# Patient Record
Sex: Female | Born: 1935 | Race: White | Hispanic: No | Smoking: Never smoker
Health system: Southern US, Community
[De-identification: ages and names within clinical notes are randomized; demographics above are authoritative.]

## PROBLEM LIST (undated history)

## (undated) DIAGNOSIS — I219 Acute myocardial infarction, unspecified: Secondary | ICD-10-CM

## (undated) DIAGNOSIS — F419 Anxiety disorder, unspecified: Secondary | ICD-10-CM

## (undated) DIAGNOSIS — M1990 Unspecified osteoarthritis, unspecified site: Secondary | ICD-10-CM

## (undated) DIAGNOSIS — E079 Disorder of thyroid, unspecified: Secondary | ICD-10-CM

## (undated) DIAGNOSIS — S12100A Unspecified displaced fracture of second cervical vertebra, initial encounter for closed fracture: Secondary | ICD-10-CM

## (undated) DIAGNOSIS — I2510 Atherosclerotic heart disease of native coronary artery without angina pectoris: Secondary | ICD-10-CM

## (undated) DIAGNOSIS — I509 Heart failure, unspecified: Secondary | ICD-10-CM

## (undated) DIAGNOSIS — I422 Other hypertrophic cardiomyopathy: Secondary | ICD-10-CM

## (undated) DIAGNOSIS — R21 Rash and other nonspecific skin eruption: Secondary | ICD-10-CM

## (undated) DIAGNOSIS — R296 Repeated falls: Secondary | ICD-10-CM

## (undated) DIAGNOSIS — G479 Sleep disorder, unspecified: Secondary | ICD-10-CM

## (undated) DIAGNOSIS — M25559 Pain in unspecified hip: Secondary | ICD-10-CM

## (undated) DIAGNOSIS — I714 Abdominal aortic aneurysm, without rupture, unspecified: Secondary | ICD-10-CM

## (undated) DIAGNOSIS — K921 Melena: Secondary | ICD-10-CM

## (undated) DIAGNOSIS — F0280 Dementia in other diseases classified elsewhere without behavioral disturbance: Secondary | ICD-10-CM

## (undated) DIAGNOSIS — G309 Alzheimer's disease, unspecified: Secondary | ICD-10-CM

## (undated) DIAGNOSIS — M4800 Spinal stenosis, site unspecified: Secondary | ICD-10-CM

## (undated) DIAGNOSIS — Z9889 Other specified postprocedural states: Secondary | ICD-10-CM

## (undated) DIAGNOSIS — R7989 Other specified abnormal findings of blood chemistry: Secondary | ICD-10-CM

## (undated) DIAGNOSIS — I10 Essential (primary) hypertension: Secondary | ICD-10-CM

## (undated) HISTORY — PX: MASTOIDECTOMY: SHX711

## (undated) HISTORY — DX: Other specified abnormal findings of blood chemistry: R79.89

## (undated) HISTORY — DX: Rash and other nonspecific skin eruption: R21

## (undated) HISTORY — DX: Sleep disorder, unspecified: G47.9

## (undated) HISTORY — DX: Pain in unspecified hip: M25.559

## (undated) HISTORY — DX: Other specified postprocedural states: Z98.890

---

## 1999-08-26 DIAGNOSIS — I219 Acute myocardial infarction, unspecified: Secondary | ICD-10-CM

## 1999-08-26 HISTORY — DX: Acute myocardial infarction, unspecified: I21.9

## 2000-05-30 ENCOUNTER — Inpatient Hospital Stay (HOSPITAL_COMMUNITY): Admission: EM | Admit: 2000-05-30 | Discharge: 2000-06-02 | Payer: Self-pay | Admitting: *Deleted

## 2001-09-20 ENCOUNTER — Encounter: Payer: Self-pay | Admitting: Internal Medicine

## 2001-09-20 ENCOUNTER — Ambulatory Visit (HOSPITAL_COMMUNITY): Admission: RE | Admit: 2001-09-20 | Discharge: 2001-09-20 | Payer: Self-pay | Admitting: Internal Medicine

## 2001-10-06 ENCOUNTER — Ambulatory Visit (HOSPITAL_COMMUNITY): Admission: RE | Admit: 2001-10-06 | Discharge: 2001-10-06 | Payer: Self-pay | Admitting: Internal Medicine

## 2001-10-26 ENCOUNTER — Encounter: Payer: Self-pay | Admitting: Internal Medicine

## 2001-10-26 ENCOUNTER — Ambulatory Visit (HOSPITAL_COMMUNITY): Admission: RE | Admit: 2001-10-26 | Discharge: 2001-10-26 | Payer: Self-pay | Admitting: Internal Medicine

## 2001-12-16 ENCOUNTER — Ambulatory Visit (HOSPITAL_COMMUNITY): Admission: RE | Admit: 2001-12-16 | Discharge: 2001-12-16 | Payer: Self-pay | Admitting: Cardiology

## 2001-12-17 ENCOUNTER — Encounter: Payer: Self-pay | Admitting: Cardiology

## 2002-01-24 ENCOUNTER — Ambulatory Visit (HOSPITAL_COMMUNITY): Admission: RE | Admit: 2002-01-24 | Discharge: 2002-01-24 | Payer: Self-pay | Admitting: Cardiology

## 2002-01-26 ENCOUNTER — Ambulatory Visit (HOSPITAL_COMMUNITY): Admission: RE | Admit: 2002-01-26 | Discharge: 2002-01-26 | Payer: Self-pay | Admitting: Cardiology

## 2002-11-08 ENCOUNTER — Ambulatory Visit (HOSPITAL_COMMUNITY): Admission: RE | Admit: 2002-11-08 | Discharge: 2002-11-08 | Payer: Self-pay | Admitting: Internal Medicine

## 2002-11-08 ENCOUNTER — Encounter: Payer: Self-pay | Admitting: Internal Medicine

## 2002-11-24 DIAGNOSIS — Z9889 Other specified postprocedural states: Secondary | ICD-10-CM

## 2002-11-24 HISTORY — DX: Other specified postprocedural states: Z98.890

## 2002-12-20 ENCOUNTER — Ambulatory Visit (HOSPITAL_COMMUNITY): Admission: RE | Admit: 2002-12-20 | Discharge: 2002-12-20 | Payer: Self-pay | Admitting: Internal Medicine

## 2002-12-20 HISTORY — PX: COLONOSCOPY: SHX174

## 2003-11-02 ENCOUNTER — Ambulatory Visit (HOSPITAL_COMMUNITY): Admission: RE | Admit: 2003-11-02 | Discharge: 2003-11-02 | Payer: Self-pay | Admitting: Internal Medicine

## 2003-12-28 ENCOUNTER — Ambulatory Visit (HOSPITAL_COMMUNITY): Admission: RE | Admit: 2003-12-28 | Discharge: 2003-12-28 | Payer: Self-pay | Admitting: Cardiology

## 2004-01-08 ENCOUNTER — Ambulatory Visit (HOSPITAL_COMMUNITY): Admission: RE | Admit: 2004-01-08 | Discharge: 2004-01-08 | Payer: Self-pay | Admitting: Internal Medicine

## 2004-02-23 ENCOUNTER — Ambulatory Visit (HOSPITAL_COMMUNITY): Admission: RE | Admit: 2004-02-23 | Discharge: 2004-02-23 | Payer: Self-pay | Admitting: Cardiology

## 2004-12-04 ENCOUNTER — Ambulatory Visit (HOSPITAL_COMMUNITY): Admission: RE | Admit: 2004-12-04 | Discharge: 2004-12-04 | Payer: Self-pay | Admitting: Internal Medicine

## 2005-01-23 ENCOUNTER — Ambulatory Visit: Payer: Self-pay | Admitting: Cardiology

## 2005-05-21 ENCOUNTER — Ambulatory Visit (HOSPITAL_COMMUNITY): Admission: RE | Admit: 2005-05-21 | Discharge: 2005-05-21 | Payer: Self-pay | Admitting: Internal Medicine

## 2005-06-24 ENCOUNTER — Ambulatory Visit: Payer: Self-pay | Admitting: Cardiology

## 2005-06-25 ENCOUNTER — Ambulatory Visit: Payer: Self-pay | Admitting: *Deleted

## 2005-06-25 ENCOUNTER — Ambulatory Visit (HOSPITAL_COMMUNITY): Admission: RE | Admit: 2005-06-25 | Discharge: 2005-06-25 | Payer: Self-pay | Admitting: Cardiology

## 2005-07-07 ENCOUNTER — Ambulatory Visit: Payer: Self-pay | Admitting: Cardiology

## 2005-07-08 ENCOUNTER — Encounter (HOSPITAL_COMMUNITY): Admission: RE | Admit: 2005-07-08 | Discharge: 2005-08-07 | Payer: Self-pay | Admitting: Cardiology

## 2005-07-08 ENCOUNTER — Ambulatory Visit: Payer: Self-pay | Admitting: *Deleted

## 2005-07-22 ENCOUNTER — Ambulatory Visit: Payer: Self-pay | Admitting: Cardiology

## 2005-12-05 ENCOUNTER — Ambulatory Visit (HOSPITAL_COMMUNITY): Admission: RE | Admit: 2005-12-05 | Discharge: 2005-12-05 | Payer: Self-pay | Admitting: Internal Medicine

## 2006-06-05 ENCOUNTER — Emergency Department (HOSPITAL_COMMUNITY): Admission: EM | Admit: 2006-06-05 | Discharge: 2006-06-05 | Payer: Self-pay | Admitting: Emergency Medicine

## 2006-06-08 ENCOUNTER — Inpatient Hospital Stay (HOSPITAL_COMMUNITY): Admission: AD | Admit: 2006-06-08 | Discharge: 2006-06-11 | Payer: Self-pay | Admitting: Orthopedic Surgery

## 2006-06-13 ENCOUNTER — Inpatient Hospital Stay (HOSPITAL_COMMUNITY): Admission: EM | Admit: 2006-06-13 | Discharge: 2006-06-16 | Payer: Self-pay | Admitting: Emergency Medicine

## 2006-06-15 ENCOUNTER — Ambulatory Visit: Payer: Self-pay | Admitting: Cardiology

## 2006-06-22 ENCOUNTER — Ambulatory Visit (HOSPITAL_COMMUNITY): Admission: RE | Admit: 2006-06-22 | Discharge: 2006-06-22 | Payer: Self-pay | Admitting: Internal Medicine

## 2006-12-23 ENCOUNTER — Ambulatory Visit (HOSPITAL_COMMUNITY): Admission: RE | Admit: 2006-12-23 | Discharge: 2006-12-23 | Payer: Self-pay | Admitting: Internal Medicine

## 2008-01-05 ENCOUNTER — Ambulatory Visit (HOSPITAL_COMMUNITY): Admission: RE | Admit: 2008-01-05 | Discharge: 2008-01-05 | Payer: Self-pay | Admitting: Internal Medicine

## 2009-03-13 ENCOUNTER — Encounter: Payer: Self-pay | Admitting: Cardiology

## 2009-08-25 DIAGNOSIS — I422 Other hypertrophic cardiomyopathy: Secondary | ICD-10-CM

## 2009-08-25 HISTORY — DX: Other hypertrophic cardiomyopathy: I42.2

## 2009-11-29 ENCOUNTER — Inpatient Hospital Stay (HOSPITAL_COMMUNITY): Admission: EM | Admit: 2009-11-29 | Discharge: 2009-11-30 | Payer: Self-pay | Admitting: Emergency Medicine

## 2009-11-29 ENCOUNTER — Ambulatory Visit: Payer: Self-pay | Admitting: Cardiology

## 2009-11-30 ENCOUNTER — Encounter: Payer: Self-pay | Admitting: Cardiology

## 2009-12-03 ENCOUNTER — Ambulatory Visit (HOSPITAL_COMMUNITY): Admission: RE | Admit: 2009-12-03 | Discharge: 2009-12-03 | Payer: Self-pay | Admitting: Cardiovascular Disease

## 2009-12-03 ENCOUNTER — Ambulatory Visit: Payer: Self-pay | Admitting: Cardiology

## 2009-12-03 ENCOUNTER — Encounter: Payer: Self-pay | Admitting: Cardiovascular Disease

## 2009-12-14 ENCOUNTER — Ambulatory Visit (HOSPITAL_COMMUNITY): Admission: RE | Admit: 2009-12-14 | Discharge: 2009-12-14 | Payer: Self-pay | Admitting: Internal Medicine

## 2009-12-31 ENCOUNTER — Ambulatory Visit (HOSPITAL_COMMUNITY): Admission: RE | Admit: 2009-12-31 | Discharge: 2009-12-31 | Payer: Self-pay | Admitting: Internal Medicine

## 2010-01-03 ENCOUNTER — Encounter: Payer: Self-pay | Admitting: Orthopedic Surgery

## 2010-01-03 ENCOUNTER — Ambulatory Visit (HOSPITAL_COMMUNITY): Admission: RE | Admit: 2010-01-03 | Discharge: 2010-01-03 | Payer: Self-pay | Admitting: Internal Medicine

## 2010-01-14 ENCOUNTER — Ambulatory Visit: Payer: Self-pay | Admitting: Orthopedic Surgery

## 2010-01-14 DIAGNOSIS — M4800 Spinal stenosis, site unspecified: Secondary | ICD-10-CM

## 2010-01-14 DIAGNOSIS — M479 Spondylosis, unspecified: Secondary | ICD-10-CM | POA: Insufficient documentation

## 2010-01-14 DIAGNOSIS — M545 Low back pain, unspecified: Secondary | ICD-10-CM | POA: Insufficient documentation

## 2010-01-14 HISTORY — DX: Spinal stenosis, site unspecified: M48.00

## 2010-01-23 ENCOUNTER — Encounter: Payer: Self-pay | Admitting: Orthopedic Surgery

## 2010-06-25 ENCOUNTER — Encounter: Payer: Self-pay | Admitting: Orthopedic Surgery

## 2010-07-01 ENCOUNTER — Ambulatory Visit (HOSPITAL_COMMUNITY)
Admission: RE | Admit: 2010-07-01 | Discharge: 2010-07-01 | Payer: Self-pay | Source: Home / Self Care | Admitting: Internal Medicine

## 2010-09-24 NOTE — Letter (Signed)
Summary: [facility] medical record request  [facility] medical record request   Imported By: Jacklynn Ganong 07/25/2010 14:02:31  _____________________________________________________________________  External Attachment:    Type:   Image     Comment:   External Document

## 2010-09-24 NOTE — Assessment & Plan Note (Signed)
Summary: LEFT HIP PAIN XR/MRI AT [facility] BRINGING DISC/BLUE MEDICARE/FAGAN/BSF   Vital Signs:  Patient profile:   77 year old female Height:      60 inches Weight:      93 pounds Pulse rate:   76 / minute Resp:     16 per minute  Vitals Entered By: Fuller Canada MD (Jan 14, 2010 10:55 AM)  Visit Type:  consult Referring Provider:  self Primary Provider:  Dr. Ouida Sills  CC:  left hip pain.  History of Present Illness: I saw Barbara Snyder in the office today for an initial visit.  She is a 77 years old woman with the complaint of:  left hip pain.  77 year old female with deteriorating health and developing Alzheimer's disease presents with a history of LEFT hip pain for the last 2 months.  She describes a throbbing intermittent chronic pain which is 9/10.  She does get some relief with 2 hydrocodone up to 8 per day.  Her pain is worse with movement.  She has weakness in the LEFT lower extremity.  Her gait has deteriorated and she started to lean to the LEFT.  She's had a CT scan of her head which was negative.  Imaging studies which were done include MRI LEFT hip Dec 31, 2009 with no abnormalities.  LEFT hip x-rays no acute abnormality  She places her hand over her LEFT gluteal region which is where her pain is and it radiates somewhat towards the thigh.  She denies any numbness or tingling.  Dr. Ouida Sills has included his last office note for our review.  This note indicates that the patient has been worked up for stroke with a CT she does have some Alzheimer's disease we also note that she has come in for analysis of her walking on April 21 of this year.  Meds: Metoprolol, Lasix, Simvastatin, Ambien, Spironolactone, ASA, Tylenol, Multivitamin, Vicodin 5, 8 per day limit.  Allergies (verified): No Known Drug Allergies  Past History:  Past Medical History: heart murmur chronic hip pain skin rashes arthriis thyroid tumor, benign htn cholesterol trouble sleeping asthma  Past  Surgical History: left ear as child  Family History: FH of Cancer:  Family History Coronary Heart Disease female < 59 Family History of Arthritis  Social History: Patient is married.  retired no smoking no alcohol 1 cup of coffee per day  Review of Systems Constitutional:  Complains of weight loss and fatigue; denies weight gain, fever, and chills. Cardiovascular:  Complains of murmurs; denies chest pain, palpitations, and fainting. Respiratory:  Denies short of breath, wheezing, couch, tightness, pain on inspiration, and snoring . Gastrointestinal:  Denies heartburn, nausea, vomiting, diarrhea, constipation, and blood in your stools. Genitourinary:  Denies frequency, urgency, difficulty urinating, painful urination, flank pain, and bleeding in urine. Neurologic:  Complains of unsteady gait; denies numbness, tingling, dizziness, tremors, and seizure. Musculoskeletal:  Complains of joint pain and stiffness; denies swelling, instability, redness, heat, and muscle pain. Endocrine:  Complains of heat or cold intolerance; denies excessive thirst and exessive urination. Psychiatric:  Complains of anxiety; denies nervousness, depression, and hallucinations. Skin:  Complains of changes in the skin, poor healing, and rash; denies itching and redness. HEENT:  Denies blurred or double vision, eye pain, redness, and watering. Immunology:  Denies seasonal allergies, sinus problems, and allergic to bee stings. Hemoatologic:  Complains of brusing; denies easy bleeding.  Physical Exam  Additional Exam:  General vital signs as recorded, small frail elderly female no acute deformities but she  does ambulate with a severe kyphosis of the thoracic spine  Vascular exam is normal  Skin exam normal  Neurologic exam shows no evidence of a positive straight leg raise.  Lymph nodes negative  She does have tenderness in the lower back in the gluteal region shows a large thoracic kyphosis.  She has normal  range of motion and pain-free range of motion of both hips with normal motor strength in her lower extremities and no joint instability and the hip knee or ankle either side.     Impression & Recommendations:  Problem # 1:  LOW BACK PAIN (ICD-724.2)  Orders: Consultation Level III (26948) Lumbosacral Spine ,2/3 views (72100)  Problem # 2:  SPINAL STENOSIS (ICD-724.00)  Orders: Consultation Level III (54627) Lumbosacral Spine ,2/3 views (72100)  Problem # 3:  SPONDYLOSIS (ICD-721.90)  x-rays of the lumbar spine shows that she has a severe thoracolumbar kyphosis with an old compression fracture thought to be at the lumbar one.  She has severe osteopenia and osteoporosis most likely.  She has degenerative disc disease.  She has scoliosis.  She is not a surgical candidate.  She will need some home care as she is getting, she should continue Vicodin for pain as well as add Neurontin 100 mg t.i.d. which can be increased and titrated to get the desired effect  Orders: Consultation Level III (03500) Lumbosacral Spine ,2/3 views (72100)  Medications Added to Medication List This Visit: 1)  Neurontin 100 Mg Caps (Gabapentin) .Marland Kitchen.. 1 by mouth three times a day  Patient Instructions: 1)  continue vicodin  2)  start neurontin 100 three times a day  3)  return as needed  4)  call us if u need a refiil\par Prescriptions: NEURONTIN 100 MG CAPS (GABAPENTIN) 1 by mouth three times a day  #90 x 1   Entered and Authorized by:   Fuller Canada MD   Signed by:   Fuller Canada MD on 01/14/2010   Method used:   Print then Give to Patient   RxID:   [id number]

## 2010-09-24 NOTE — Letter (Signed)
Summary: History form  History form   Imported By: Jacklynn Ganong 01/18/2010 08:46:12  _____________________________________________________________________  External Attachment:    Type:   Image     Comment:   External Document

## 2010-09-24 NOTE — Miscellaneous (Signed)
Summary: Health care power of attorney  Health care power of attorney   Imported By: Jacklynn Ganong 01/23/2010 14:35:39  _____________________________________________________________________  External Attachment:    Type:   Image     Comment:   External Document

## 2010-09-24 NOTE — Letter (Signed)
Summary: *Orthopedic Consult Note  [facility]  [address] [place] [address]  [place], [place] [address]   Phone: [phone]  Fax: [phone]    Re:    LAPORCHE MARTELLE DOB:    June 14, 1920   Dear: Dr. Ouida Sills   Thank you for requesting that we see the above patient for consultation.  A copy of the detailed office note will be sent under separate cover, for your review.  Evaluation today is consistent with:  1)  LOW BACK PAIN (ICD-724.2) 2)  SPINAL STENOSIS (ICD-724.00) 3)  SPONDYLOSIS (ICD-721.90) 4)  FAMILY HISTORY OF ARTHRITIS (ICD-V17.7) 5)  FAMILY HISTORY CORONARY HEART DISEASE FEMALE < 55 (ICD-V17.3)   Our recommendation is for: medical management.  Continue Vicodin and add Neurontin       Thank you for this opportunity to look after your patient.  Sincerely,   Terrance Mass. MD.

## 2010-11-13 LAB — BASIC METABOLIC PANEL
BUN: 13 mg/dL (ref 6–23)
CO2: 25 mEq/L (ref 19–32)
Calcium: 8.9 mg/dL (ref 8.4–10.5)
Chloride: 94 mEq/L — ABNORMAL LOW (ref 96–112)
Creatinine, Ser: 0.96 mg/dL (ref 0.4–1.2)
GFR calc Af Amer: >60 mL/min (ref >60)
GFR calc non Af Amer: 55 mL/min — ABNORMAL LOW (ref >60)
Glucose, Bld: 133 mg/dL — ABNORMAL HIGH (ref 70–99)
Potassium: 4 mEq/L (ref 3.5–5.1)
Sodium: 126 mEq/L — ABNORMAL LOW (ref 135–145)

## 2010-11-13 LAB — CBC
HCT: 33.9 % — ABNORMAL LOW (ref 36.0–46.0)
Hemoglobin: 12 g/dL (ref 12.0–15.0)
MCHC: 35.3 g/dL (ref 30.0–36.0)
MCV: 100.1 fL — ABNORMAL HIGH (ref 78.0–100.0)
Platelets: 193 10*3/uL (ref 150–400)
RBC: 3.39 MIL/uL — ABNORMAL LOW (ref 3.87–5.11)
RDW: 13.9 % (ref 11.5–15.5)
WBC: 7 10*3/uL (ref 4.0–10.5)

## 2010-11-13 LAB — CARDIAC PANEL(CRET KIN+CKTOT+MB+TROPI)
CK, MB: 4 ng/mL (ref 0.3–4.0)
CK, MB: 4.4 ng/mL — ABNORMAL HIGH (ref 0.3–4.0)
Relative Index: 2.9 — ABNORMAL HIGH (ref 0.0–2.5)
Relative Index: 3 — ABNORMAL HIGH (ref 0.0–2.5)
Total CK: 135 U/L (ref 7–177)
Total CK: 150 U/L (ref 7–177)
Troponin I: 0.02 ng/mL (ref 0.00–0.06)
Troponin I: 0.02 ng/mL (ref 0.00–0.06)
Troponin I: 0.03 ng/mL (ref 0.00–0.06)

## 2010-11-13 LAB — DIFFERENTIAL
Basophils Absolute: 0 10*3/uL (ref 0.0–0.1)
Basophils Relative: 0 % (ref 0–1)
Eosinophils Relative: 0 % (ref 0–5)
Lymphocytes Relative: 9 % — ABNORMAL LOW (ref 12–46)

## 2010-11-13 LAB — POCT CARDIAC MARKERS
Troponin i, poc: <0.05 ng/mL (ref 0.00–0.09)
Troponin i, poc: <0.05 ng/mL (ref 0.00–0.09)

## 2011-01-10 NOTE — Procedures (Signed)
NAME:  Barbara Snyder, Barbara Snyder NO.:  [id number]   MEDICAL RECORD NO.:  [id number]          PATIENT TYPE:  OUT   LOCATION:  RAD                           FACILITY:  [facility]   PHYSICIAN:  Vida Roller, M.D.   DATE OF BIRTH:  12/24/1919   DATE OF PROCEDURE:  06/25/2005  DATE OF DISCHARGE:                                  ECHOCARDIOGRAM   PRIMARY CARE PHYSICIAN:  Kingsley Callander. Ouida Sills, MD   TAPE NUMBER:  [id number]   TAPE COUNT:  [id number]   HISTORY OF PRESENT ILLNESS:  This is an 77 year old female with increasing  dyspnea on exertion, assessment for LV systolic function.   This is a very technically difficult study.   M-mode tracings were not accurate.   Overall, the left ventricular size is normal.  The ejection fraction is  vigorous at 70-75%.  There is some left ventricular hypertrophy.  No obvious  wall motion abnormalities, but this is a difficult study in that multiple  nonstandard views were used.   There does not appear to be any significant valvular heart disease,  although, it is a tremendously inadequate study for this.   If significant concern exists for severe valvular heart disease,  consideration should be made for further evaluation including potentially  transesophageal echocardiogram.      Vida Roller, M.D.  Electronically Signed     JH/MEDQ  D:  06/25/2005  T:  06/25/2005  Job:  [id number]   cc:   Kingsley Callander. Ouida Sills, MD  Fax: [phone]

## 2011-01-10 NOTE — Procedures (Signed)
[facility]  Patient:    Barbara Snyder, RAUH Visit Number: [id number] MRN: [id number]          Service Type: OUT Location: RAD Attending Physician:  Cain Sieve Dictated by:   Lavella Hammock, P.A. Proc. Date: 12/16/01 Admit Date:  12/16/2001                                Stress Test  DATE OF BIRTH:  March 07, 1920  INDICATIONS:  Mrs. Mauro is an 77 year old female with a history of aortic insufficiency without significant aorto stenosis and an EF of 55-60% by recent echocardiogram.  Additionally, she had a catheterization in 2001 that showed multiple areas of 30-50% stenosis including the left main, LAD, circumflex, and ramus intermedius.  She has had dyspnea on exertion such that she is unable to go up a flight of steps without stopping to rest.  She is here today for an exercise Cardiolite.  The patient exercised for a total of 2 minutes and 16 seconds.  Her target heart rate was 119 and she achieved this within one minute.  Her Cardiolite was injected at a heart rate of 131 and the exercise continued for another minute.  Total exercise time was 2 minutes and 16 seconds.  Her resting EKG had flattening of the ST segments in the inferior and lateral leads.  There was minimal depression of the ST segments but there was no downsloping during the exercise portion of the test.  She had a good blood pressure response to exercise with maximum blood pressure of 160/70 just after the recovery period was started and a starting blood pressure of 160/70.  The patient had no chest pain during the procedure, although she became extremely short of breath and stated that she was "give out."  She had a few PVCs during the stress portion of the test, but otherwise no arrhythmia.  CONCLUSION:  Exercise treadmill test electrically negative for ischemia with Cardiolite images pending at the time of dictation. Dictated by:   Lavella Hammock,  P.A. Attending Physician:  Cain Sieve DD:  12/16/01 TD:  12/17/01 Job: [id number]

## 2011-01-10 NOTE — Op Note (Signed)
NAME:  Barbara Snyder, Barbara Snyder           ACCOUNT NO.:  [id number]   MEDICAL RECORD NO.:  [id number]          PATIENT TYPE:  INP   LOCATION:  6743                         FACILITY:  [facility]   PHYSICIAN:  Katy Fitch. Sypher, M.D. DATE OF BIRTH:  09-08-1919   DATE OF PROCEDURE:  06/09/2006  DATE OF DISCHARGE:                                 OPERATIVE REPORT   PREOPERATIVE DIAGNOSIS:  Status post open type 1 fracture, right ulna,  sustained on June 05 2006, and severely comminuted fracture of right  distal radius with a Galeazzi equivalent with complete dislocation of the  distal radioulnar joint and disruption of the distal radioulnar ligaments.   POSTOPERATIVE DIAGNOSES:  1. Type 1 open fracture, right distal ulna with evidence of inside-out      extrusion of ulnar head through skin and contamination of subcutaneous      space, treated by excisional debridement at this time.  2. Comminuted fractured distal radius with complete disruption of the      distal radioulnar joint, which was addressed with open reduction and      internal fixation after 24 hours of intravenous antibiotic therapy and      observation of vital signs to rule out early sepsis.   OPERATION:  1. Excisional debridement of type 1 open fracture of right ulna.  2. Irrigation debridement of distal radioulnar joint and distal ulnar      fracture site.  3. Open reduction internal fixation of right distal radial comminuted      intra-articular explosion-type fracture utilizing a 6-peg DVR plate      system and cancellous allograft.   OPERATING SURGEON:  Josephine Igo, M.D.   ASSISTANT:  No assistant.   ANESTHESIA:  General by LMA.   SUPERVISING ANESTHESIOLOGIST:  Guadalupe Maple, M.D.   INDICATIONS:  Barbara Snyder is an 77 year old right-hand-dominant  woman with background medical issues of coronary artery disease,  hyperlipidemia, hypertension and hearing impairment.   She was referred by her family physician,  Dr. Carylon Perches, at the suggestion  of Dr. Cleda Clarks, attending orthopedic surgeon in [place], [place], for evaluation and management of an extremely complex right wrist  injury sustained on the afternoon of June 05, 2006.   At that time, Barbara Snyder was walking in her yard near her driveway and  fell, sustaining a complex injury to her right dominant wrist.  She was  transported to [facility], where she was evaluated by Dr. Radford Pax,  the emergency room physician.  Her wounds were apparently inspected and a  cursory manner and a commentary was made that she had a wrist abrasion.   Inspection of her wrist after her referral on June 08, 2006 revealed that  she had an inside-out type 1 open fracture of her distal ulna.   Barbara Snyder was provided IV antibiotic therapy at the [facility] Emergency  Room consisting of 1 g of Ancef followed by a prescription for oral Keflex.   There is no record of her receiving tetanus prophylaxis.  She is uncertain  of her tetanus status.   She was placed  in a prefabricated plaster and foam splint by Dr. Hilda Snyder  with a Webril wrap on her skin.   She was discharged from the emergency room by Dr. Hilda Snyder, though his notes  suggested he had advised a Hand Surgery consult.   She return to see Dr. Hilda Snyder on the morning of June 08, 2006 at his  office and at that time, an urgent referral to orthopedic and hand  specialist was arranged in [place].   The referral was arranged through Dr. Ouida Sills rather than directly through a  Dr. Hilda Snyder.   On examination in the office, Barbara Snyder was noted have a 4+ swollen,  ecchymotic right hand with diminished sensibility in the median  distribution.   Her x-rays in the emergency room and her post-splinting films documented  profound displacement of her fracture fragments with disruption of the  distal radioulnar joint.   Removal of her dressing in our office revealed that she  had a tight wrap of  unyielding Webril around her wrist.  This was causing a tourniquet effect,  causing venous distension and significant swelling of her hand.  Upon  careful removal of her dressing, she had an obvious wound that was bleeding  and draining marrow fat.  I surmised in the office that she had either type  I or type II open fracture.   We advised immediate admission to [facility] in an effort to maximize the  ability to prevent sepsis of her fracture site at the ulna.   She had eaten, therefore immediate incision and drainage were not possible.  We advised her to be admitted and given 24 hours of IV Ancef with careful  observation of her arm and vital signs to be certain she was not developing  an early wound infection.   After 24 hours of observation, she remained afebrile and tolerated the Ancef  well.   She was placed at an n.p.o. status at midnight on June 08, 2006 for  surgery as an add-on on June 09, 2006.   After informed consent with Barbara Snyder, her son and her sister, she is  brought to the operating room at this time.   PROCEDURE:  Barbara Snyder was brought to the operating room and placed  in supine position upon the operating table.   Following an anesthesia consult by Dr. Ivin Booty and Dr. Noreene Larsson, an axillary  block was deferred due to her age and frail status.   General anesthesia by LMA technique was induced under Dr. Morley Kos direct  supervision, followed by Betadine scrub and paint of the right upper  extremity.  A sterile stockinette and arthroscopy drapes were applied,  followed by exsanguination of the right arm with an Esmarch bandage and  inflation of the arterial tourniquet on the proximal brachium to 230 mmHg.  The procedure commenced with an orderly excisional debridement of her open  fracture wound.  The skin margins were debrided to clean non-erythematous margins.  Subcutaneous tissues were sharply debrided with a scalpel and   tenotomy scissors.  Environmental debris including some grit was removed  from the subcutaneous space.  It was apparent that the ulnar head and neck  had extruded through the skin at some point.  Debris was found to the region  of the extensor retinaculum.   After thorough irrigation of this fracture site, a rubber band drain was  placed through-and-through from palmar to dorsal along the dorsal aspect of  the distal ulna.   The wound was then dressed  open.   Attention was then directed to the distal radial fracture.  With closed  reduction, it was apparent that Barbara Snyder had had a prior radial  fracture or growth arrest of the radius.  With reduction of the fracture,  her ulna was still 1 cm plus.  We used the C-arm fluoroscope to examine her  elbow and indeed there was no sign of a proximal ulnar or radial fracture to  account for the length disparity.   After closed reduction, we proceeded with standard approach to the volar  radius with a DVR-type incision paralleling the flexor carpi radialis.   A curvilinear incision was fashioned, exposing the flexor carpi radialis.  The radial artery was identified and gently retracted.  The fascia of the  floor of the flexor carpi radialis was incised longitudinally followed by  retraction of the flexor pollicis longus and pronator quadratus in an ulnar  direction.   The fracture site was opened with a clamp on the proximal radius by 90  degrees' rotation in an ulnar direction, followed by use of a Freer to  disimpact the fracture fragments.  Cancellous allograft was packed into the  radial styloid, reducing the articular fragment anatomically, followed by  reduction of fracture with anatomic interdigitation of the volar cortex.   A 6-peg DVR plate system was applied with standard technique, utilizing both  threaded screws in the radial styloid and lunate facet region and smooth  pins deep to the floor the 4th dorsal compartment.    Great care was taken to measure the length of screws to prevent piercing the  dorsal cortex in the 4th dorsal compartment.   The fracture site was then copiously irrigated with sterile saline and the  cortical screws placed in the radial shaft.  A virtually anatomic reduction  of the articular surface was achieved.  Barbara Snyder was noted be at least  8 mm ulnar plus and had gross instability of the distal radioulnar joint.   It is apparent that she has ruptured the dorsal and volar radioulnar  ligaments.  It is not clear whether this is acute or chronic.  She has  obviously had of prior growth arrest of the radius.   The ulnar wound was dressed open.  The wound exposing the radius was closed  by repair the pronator quadratus over the plate with a running suture of 2-0  Vicryl followed by repair of the skin with a subdermal suture of 4-0 Vicryl and intradermal 3-0 Prolene.  Adaptic was applied to the wound followed by a  voluminous sterile gauze dressing with 4 x 4's, Kerlix, sterile Webril and a  sugar-tong splint, maintaining the forearm in a neutral position and the  hand in a safe position.   There were no apparent complications.   We will address the distal radioulnar joint with closed treatment at this  time.  Given Barbara Snyder's questionable wound sepsis and frail status,  hardware was not placed across this radioulnar joint at this time.   She was wakened from general anesthesia and transferred to the recovery room  with stable vital signs.   She will be admitted to 6700 for continuation of her IV Ancef, observation  of her vital signs and will have a Foley catheter placed for 24 hours.      Katy Fitch Sypher, M.D.  Electronically Signed     RVS/MEDQ  D:  06/09/2006  T:  06/11/2006  Job:  [id number]

## 2011-01-10 NOTE — Procedures (Signed)
NAME:  Barbara Snyder, CLAYCOMB NO.:  [id number]   MEDICAL RECORD NO.:  [id number]          PATIENT TYPE:  rec   LOCATION:  RAD                           FACILITY:  [facility]   PHYSICIAN:  Edward L. Juanetta Gosling, M.D.DATE OF BIRTH:  [birth year]   DATE OF PROCEDURE:  07/29/2005  DATE OF DISCHARGE:                              PULMONARY FUNCTION TEST   FINDINGS:  1.  Spirometry is normal.  2.  Lung volumes show no evidence of restrictive change.  3.  Diffusing capacity is minimally reduced.  4.  Arterial blood gases are normal.      Edward L. Juanetta Gosling, M.D.  Electronically Signed     ELH/MEDQ  D:  07/29/2005  T:  07/30/2005  Job:  [id number]

## 2011-01-10 NOTE — H&P (Signed)
NAME:  Barbara Snyder, Barbara Snyder NO.:  [id number]   MEDICAL RECORD NO.:  [id number]          PATIENT TYPE:  INP   LOCATION:  A201                          FACILITY:  [facility]   PHYSICIAN:  Catalina Pizza, M.D.        DATE OF BIRTH:  1920-06-08   DATE OF ADMISSION:  06/13/2006  DATE OF DISCHARGE:  LH                                HISTORY & PHYSICAL   PHYSICIANS:  1. Primary M.D. is Dr. Ouida Sills.  2. Orthopedic doctor is Dr. Hilda Lias and recently Dr. Teressa Senter.   HISTORY OF PRESENT ILLNESS:  Barbara Snyder is an 77 year old white female  who recently had a fall on June 05, 2006, with fracture of her right  wrist.  Was seen and evaluated in the emergency department and seen by Dr.  Hilda Lias.  Apparently was set up for further evaluation by Dr. Teressa Senter, and  apparently had a significant open fracture requiring extensive surgical care  for this.  She was discharged on June 11, 2006 back to home.  Her son was  staying with her and apparently she did fine yesterday, but this morning at  approximately 7:00 a.m. her son got up and found her lying in the floor, as  if she was eating breakfast and had passed out, falling to the left, hitting  her head on a table beside the breakfast table resulting in a laceration and  significant bleeding to her head.  She was unresponsive initially when her  son got to her but responded shortly after he started talking with her, as  if she was waking up.  Per her son, she was mildly confused, which is  unclear exactly what her baseline is.  She does have some short-term memory  problems, per his report.  She was brought in to the emergency department  for further evaluation of this syncopal-type episode.   PAST MEDICAL HISTORY:  Unclear of her full history, but she does have a  history of coronary artery disease before, a systolic murmur mentioned  previously suggestive of mitral regurgitation, a history of hypertension,  history of hyperlipidemia, history  of gastroesophageal reflux disease, and a  question of some mild dementia and possibly some anxiety and depression.   MEDICATIONS:  At home are aspirin 81 mg p.o. daily, Lipitor 40 mg p.o.  daily, Aldactone 50 mg p.o. b.i.d., Lexapro 10 mg p.o. daily, calcium with  vitamin D p.o. daily unknown specific dose, multivitamin p.o. daily, Ambien  12.5 mg q.h.s., Keflex 500 mg p.o. q.i.d., lorazepam 0.5 mg p.o. daily,  furosemide 40 mg p.o. daily, Toprol XL 25 mg p.o., and Darvocet N 100 1  tablet q. 6h p.r.n. for pain.   ALLERGIES:  NO KNOWN DRUG ALLERGIES.   REVIEW OF SYSTEMS:  The patient denies any fever or chills.  No problems  with appetite.  Denies any recent chest pain.  No shortness of breath.  No  abdominal pains.  No constipation or diarrhea.  No significant lower  extremity swelling.  Denies any change in her vision or blurry vision.  No  dizziness or weakness.   PHYSICAL EXAMINATION:  VITAL SIGNS:  Temperature is 96.3, heart rate is 82,  blood pressure is 114/62, respiratory rate is 18, sating at 94-98% on room  air.  GENERAL:  This is an elderly white female lying in bed in no acute distress,  son is at bedside.  HEENT:  Pupils equal, round and reactive to light and accommodation.  Oropharynx is clear.  Mucous membranes are moist.  Does have approximately a  2 cm laceration to her left temporal area repaired with staples; no signs of  bleeding at this time.  NECK:  No JVD.  No thyromegaly.  Neck is supple.  LUNGS:  Good air movement throughout.  No rhonchi.  No crackles.  HEART:  Regular rate and rhythm with approximately a 2/6 systolic murmur.  ABDOMEN:  Soft, nontender and nondistended.  Positive bowel sounds.  EXTREMITIES:  No lower extremity edema.  Right arm is extensively wrapped.  She does have some bruising on her third and fourth fingers but does have  sensation to palpation on these, and does appear to have cap refill,  although all of the fingers on that hand are  cool to touch.  NEUROLOGIC:  The patient knows that she is at the hospital, unable to decide  whether at [facility] or [facility].  She is aware of a general date but  does have some signs of some underlying confusion about her medicines and,  specifically, about what occurred this morning.  She states the next thing  she remembers from this morning is coming in to the hospital, not  specifically her son waking her up.   LABORATORY DATA:  Lab work obtained.  Reveals a CBC which shows a white  count of 6 and hemoglobin of 10.7.  CMET shows a sodium of 130, potassium of  4.2, chloride of 93, CO2 of 32, glucose of 89, BUN of 17, and creatinine of  1.1.  Total bili is 0.8, alk phos is 46, SGOT is 38, SGPT is 13, total  protein is 6.2, albumin is 2.9, and calcium is 8.8.  PT is 13.7 and INR is  1.  UA was negative with specific gravity of 1.01.  Cardiac markers showed a  myoglobin of 202, troponin I of less than 0.05, and CK-MB of 5.2.   RADIOLOGIC DATA:  Images obtained did have a chest x-ray which revealed  suboptimal inspiration, no acute cardiopulmonary disease noted.   CT of the head revealed no acute intracranial abnormality, moderate  generalized atrophy, including cerebellar atrophy.   IMPRESSION:  This is an 77 year old who underwent surgery for right  extensive fracture to her forearm, who was discharged on June 11, 2006  and apparently had a syncopal episode at home this morning of unknown  etiology.   ASSESSMENT AND PLAN:  1. Syncope, unclear cause at this time.  Has had cardiology workup before.      Question whether related to cardiac arrhythmia, given its acuity, but      given all of her other issues, question whether related to medications      as far as pain medicines are concerned.  She does not appear to be      dehydrated, although orthostasis could be a possibility.  Without     direct observation of this event, it is difficult to say.  Will admit      to  telemetry and monitor for any signs of arrhythmia.  Will also get      carotid Dopplers and likely  a 2-D echo, since history was there before      of some question of mitral regurgitation.  The patient is unclear about      the medicines that she took, although she states she took all blood      pressure medicine; did not take any pain medicine.  She is also on      Ambien at night and question whether this resulted in some difficulty,      as well.  2. Right forearm fracture.  She is on pain medicine for this and she does      have a cool fingers to touch but does have some cap refill.  Need to      continue to monitor.  Apparently she did have some significant      occlusion when was seen and assessed by the orthopedic surgeon before,      with a significant amount of swelling; unclear whether need to get Dr.      Hilda Lias to assess from the ortho standpoint.  She is continued on      Keflex, since it was an open fracture and unclear exactly the length of      time she is supposed to remain on this.  3. Mild dementia.  It is unclear of her baseline, but she is able to do      most of her activities of daily living, but likely will definitely need      more of a 24-hour care.  Unclear exactly how she was taking her      medicines and the patient is unclear about this, as well.  4. History of coronary artery disease.  She has had a workup before with a      stress test which did not reveal significant problems back in December      of 2006.  She is on medical management including Lipitor and Toprol.      Unclear if she has a history of congestive heart failure, given the      fact she is on Lasix and Aldactone.   DISPOSITION:  The patient will be admitted to telemetry and will follow up  with Dr. Ouida Sills for this.  The patient is very anxious about coming in to the  hospital; will defer any medical changes to Dr. Ouida Sills.      Catalina Pizza, M.D.  Electronically Signed     ZH/MEDQ  D:   06/13/2006  T:  06/13/2006  Job:  [id number]

## 2011-01-10 NOTE — Procedures (Signed)
NAME:  Barbara Snyder, Barbara Snyder NO.:  [id number]   MEDICAL RECORD NO.:  [id number]          PATIENT TYPE:  INP   LOCATION:  A201                          FACILITY:  [facility]   PHYSICIAN:  Gerrit Friends. Dietrich Pates, MD, FACCDATE OF BIRTH:  Jan 21, 1920   DATE OF PROCEDURE:  06/15/2006  DATE OF DISCHARGE:                                  ECHOCARDIOGRAM   CLINICAL DATA:  An 77 year old woman with syncope.  Aorta 2.8, left atrium  2.3, septum 1.6, posterior wall 1.2, LV diastole 2.6, LV systole 2.0.  1. Technically suboptimal but adequate echocardiographic study.  2. Normal left atrium, right atrium and right ventricle.  3. Mild mitral valve calcification and mild annular calcification.  4. Moderate sclerosis of the aortic valve leaflets with mild to moderate      impairment in leaflet separation; very mild stenosis and insufficiency      by Doppler.  5. Moderate calcification of the aortic annulus and the wall of the      proximal ascending aorta; mild dilatation.  6. Grossly normal pulmonic valve and proximal pulmonary artery.  7. Normal tricuspid valve.  8. Normal left ventricular size; mild hypertrophy, particularly involving      the proximal septum.  Normal regional and global LV systolic function.  9. Normal IVC.  10.Comparison to prior study of June 25, 2005:  Improved technical      quality of the current study; aortic valve disease not appreciated on      the previous examination.      Gerrit Friends. Dietrich Pates, MD, [facility]  Electronically Signed     RMR/MEDQ  D:  06/15/2006  T:  06/16/2006  Job:  [id number]

## 2011-01-10 NOTE — Consult Note (Signed)
NAME:  Barbara Snyder, MILBRATH NO.:  [id number]   MEDICAL RECORD NO.:  [id number]          PATIENT TYPE:  EMS   LOCATION:  ED                            FACILITY:  [facility]   PHYSICIAN:  J. Darreld Mclean, M.D. DATE OF BIRTH:  23-May-1920   DATE OF CONSULTATION:  06/05/2006  DATE OF DISCHARGE:                                   CONSULTATION   HISTORY:  I was asked to see her by the ER physician.  The patient is a 77-  year-old female who fell injuring her right wrist.  X-ray revealed a  comminuted bone fracture of the right distal radius.  She has an abrasion on  her right distal arm and __________ .  The patient __________ .   One percent plain Xylocaine block was given and attempted closed reduction  was carried out and a sugar tong split was applied and local dressing was  applied. __________ 100 of Keflex.  He is off until Monday, __________ being  seen by the hand surgeon for open treatment and internal reduction of this  fracture.  __________ she should come back to the emergency room __________  .  Again, if she has any trouble she should come back to the emergency room.           ______________________________  Shela Commons. Darreld Mclean, M.D.     JWK/MEDQ  D:  06/05/2006  T:  06/06/2006  Job:  [id number]

## 2011-01-10 NOTE — Discharge Summary (Signed)
NAME:  Barbara Snyder, ACERO           ACCOUNT NO.:  [id number]   MEDICAL RECORD NO.:  [id number]          PATIENT TYPE:  INP   LOCATION:  6743                         FACILITY:  [facility]   PHYSICIAN:  Katy Fitch. Sypher, M.D. DATE OF BIRTH:  [birth year]   DATE OF ADMISSION:  06/08/2006  DATE OF DISCHARGE:  06/11/2006                               DISCHARGE SUMMARY   ADMISSION DIAGNOSES:  1. Right wrist Galeazzi equivalent distal radius/ulna fracture.  2. 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  3. Open ulna fracture.  4. Hypertension.  5. Coronary artery disease.  6. HOH.   DISCHARGE DIAGNOSES:  1. Right wrist Galeazzi equivalent distal radius/ulna fracture.  2. 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  3. Open ulna fracture.  4. Hypertension.  5. Coronary artery disease.  6. HOH.   OPERATION PERFORMED:  1. Irrigation and debridement of open fracture distal ulna with      excisional debridement.  2. ORIF of comminuted distal radius fracture with 6-peg DVR plate      fixation by Dr. Katy Fitch. Sypher on June 09, 2006.   CONSULTATIONS:  None.   HISTORY:  The patient is an 77 year old right hand dominant female who  is status post fall on June 05, 2006.  She sustained an open fracture  of her right distal radius.  She was seen and evaluated at [facility]  ER.  She was evaluated by Dr. Radford Pax for inspection of her wrist after  referral on June 08, 2006, revealed that she had an inside-out type I  open fracture of her distal ulna.  She was provided IV antibiotic  therapy at [facility] ER, consisting of 1 g of Ancef followed by a  prescription of oral Keflex.  She was placed in a plaster foam splint by  Dr. Hilda Lias, the orthopedic attending, with Webril wrap.  She was  discharged from the emergency room by Dr. Hilda Lias.  She returned to see  Dr. Hilda Lias on the morning of June 08, 2006, at his office.  At that  time, an urgent referral to orthopedic hand specialist was arranged  for  [facility].  She was seen in our office and she was noted to have 4+  swelling with ecchymotic right hand and diminished sensibility  throughout the median distribution.  It was decided that she would need  to be brought to the operating room to undergo ORIF of her comminuted  distal radius fracture with irrigation and debridement of her distal  ulnar open type fracture.  She had eaten in the immediate hours prior to  presentation to our office.  She was therefore admitted to the hospital  for 24 hours of IV Ancef with observation of her arm and vital signs  with anticipated surgery on June 09, 2006.  Preop labs revealed a  hemoglobin of 11.2, hematocrit of 32.8, white blood cell count of 6.3,  and 187,000 platelets.  Chemistry profile revealed a low sodium at 126,  chloride low at 92, albumin low at 3.2.  The remainder of the chemistry  profile within normal limits.  Her urinalysis essentially normal, except  for  a mild amount of urobilinogen.  Preop EKG revealed normal sinus  rhythm and preop chest x-ray revealed hyperinflation without acute or  superimposed abnormality.   HOSPITAL COURSE:  The patient was admitted as stated above.  On June 08, 2006, she was given Ancef 1 g IV q.8 hours for approximately 24  hours, maintained on her usual preoperative medications, and she  received a combination of Dilaudid and Percocet for pain control.  She  was then made n.p.o. after midnight on June 08, 2006, and brought to  the operating room on June 09, 2006.  She underwent ORIF of her right  distal radius fracture and irrigation and debridement of her open  fracture of the distal ulna with excisional debridement.  Postoperatively she was placed back on her usual preop medications,  given additional Ancef in the form of 1 g IV q.8 hours for 3 more doses.  The first day postop, on June 10, 2006, she was afebrile with stable  vital signs.  O2 saturation was 97%.  She was  sitting up in bedside  chair and tolerating this well.  She had a good appetite.  No nausea or  vomiting.  Her Foley had been removed.  She had not voided at the time  of rounds.  On exam her dressing was dry and intact.  Neurovascularly  she was intact and she had good range of motion of her digits.  The  following day, on June 11, 2006, her second day postop, she remained  afebrile with stable vital signs.  She was voiding well.  No bowel  movement at that time.  Neurovascularly she was intact.  She had full  range of motion of her digits.  At this time it was felt that the  patient was stable and ready for discharge with her family.  Her sister  was very attentive to her needs and her son was retired from work and  was available for assistance on an as-needed basis.   At this time, the patient was given prescriptions for Percocet 5 mg,  #30, one or two p.o. q.4-6 hours p.r.n. pain and she was asked to  continue her other medications as usual in the form of:  1. Metoprolol 25 mg one p.o. q. day.  2. Aldactone 50 mg 1 p.o. q. day.  3. Lipitor 40 mg 1 p.o. q. day.  4. Lorazepam 0.5 mg 1 p.o. b.i.d. p.r.n.  5. Lexapro 10 mg 1 p.o. q. day.  6. Lasix 40 mg 1 p.o. q. day.   ACTIVITY:  The patient will continue to elevate her arm, work on finger  range of motion, in addition to her shoulder.   WOUND CARE:  She will keep her dressing dry and intact.   RETURN APPOINTMENT:  She will return to our office in approximately one  week, or sooner should she have any problems.   CONDITION ON DISCHARGE:  Stable and improving.   FINAL DISCHARGE DIAGNOSIS:  Galeazzi equivalent right distal radius  fracture with open ulna fracture requiring open reduction internal  fixation and debridement.      Jonni Sanger, P.A.      Katy Fitch Sypher, M.D.  Electronically Signed  RJD/MEDQ  D:  09/15/2006  T:  09/15/2006  Job:  [id number]

## 2011-01-10 NOTE — Procedures (Signed)
NAME:  Barbara Snyder, Barbara Snyder NO.:  [id number]   MEDICAL RECORD NO.:  [id number]          PATIENT TYPE:  REC   LOCATION:                                FACILITY:  [facility]   PHYSICIAN:  Vida Roller, M.D.   DATE OF BIRTH:  February 03, 1920   DATE OF PROCEDURE:  DATE OF DISCHARGE:                                    STRESS TEST   INDICATIONS:  [name] is an 77 year old female with known coronary  disease, status post non-Q wave MI in October 2001.  She had a 40% left main  and a 50% circumflex with an EF of 50% at that time.  She also has  hypertension and hyperlipidemia.  She complains of exertional dyspnea and  occasional chest discomfort.   BASELINE DATA:  Electrocardiogram reveals a sinus rhythm at 78 beats per  minute with nonspecific ST abnormalities.  Blood pressure is 128/68.   The patient exercised for a total of 3 minutes and 15 seconds in Bruce  Protocol stage I.  She reached 4.6 METS.  Maximum heart rate was 144 beats  per minute which is 105% of predicted maximum.  Maximum blood pressure is  170/62 and resolved down to 132/68 in recovery.  The patient reported  dyspnea with exercise that resolved in recovery.  No chest pain.  EKG  revealed no ischemic changes.  A few PVCs were noted.  Exercise was stopped  secondary to fatigue.   Final images and results are pending M.D. review.      Jae Dire, P.A. [facility]      Vida Roller, M.D.  Electronically Signed    AB/MEDQ  D:  07/08/2005  T:  07/08/2005  Job:  [id number]

## 2011-01-10 NOTE — Cardiovascular Report (Signed)
[place]  Patient:    Barbara Snyder, Barbara Snyder                  MRN: [id number] Proc. Date: 06/01/00 Adm. Date:  [id number] Attending:  Veneda Melter CC:         Enid Cutter, M.D.  Veneda Melter, M.D. [facility]   Cardiac Catheterization  PROCEDURES PERFORMED:  Left and right heart catheterization/coronary arteriography.  INDICATIONS:  Evaluate patient with congestive heart failure and a non-Q wave myocardial infarction.  PROCEDURAL NOTE:  Left and right heart catheterizations were performed via the right femoral artery and vein respectively.  Both vessels were cannulated using an anterior wall puncture.  A 6-French arterial sheath was inserted via the modified Seldinger technique.  Preformed Judkins and a pigtail catheter were utilized.  The patient tolerated the procedure well and left the laboratory in stable condition.  RESULTS:  HEMODYNAMICS: LV:  105/23. AO:  101/57. RA:  Mean 8. RV:  37/11. PA:  29/18, mean of 23. The pulmonary capillary wedge pressure mean was 16 (without a significant V-wave).  Cardiac output/cardiac index (thermodilution):  2.9/1.83. Cardiac output/cardiac index (Fick):  3.6/2.3.  CORONARY ARTERIES: 1.  The left main had an ostial 30% to 40% stenosis.  2.  The left anterior descending artery had a proximal long 25% stenosis.     There was distal 30% stenosis.  The first diagonal had ostial 25%     stenosis.  3.  The circumflex had ostial 50% stenosis and mid 25% stenosis.  4.  The ramus intermedius had ostial 30% stenosis.  5.  The right coronary artery was a large dominant vessel.  It had diffuse     luminal irregularities.  The vessels were calcified.  LEFT VENTRICULOGRAM:  The left ventriculogram was obtained in the RAO and LAO projections.  The ejection fraction was approximately 50% with 2+ to 3+ mitral regurgitation.  There was moderate mid anterior wall hypokinesis and mild posteroinferior  akinesis.  AORTOGRAM/DISTAL AORTOGRAM:  Aortogram and distal aortogram were obtained secondary to some difficulty advancing the wire and a tortuous course of the wire.  She did have a mildly dilated ascending aorta, however, the distal aorta was free of significant disease.  It was a tortuous vessel.  CONCLUSIONS: 1.  Nonobstructive coronary artery disease. 2.  Mildly reduced left ventricular function, with regional wall motion     abnormalities. 3.  Moderate versus moderately severe mitral regurgitation with a decreased     cardiac output.  PLAN:  The etiology of the regional wall motion in the MI is in question.  It could have been plaque rupture versus vasospasm.  Her CHF symptoms are out of proportion to the degree of left ventricular dysfunction.  This might be explained by the mitral regurgitation.  The next step will be transthoracic echocardiogram with a transesophageal echocardiogram if needed to further evaluate the degree of mitral regurgitation. DD:  06/01/00 TD:  06/01/00 Job: [id number]

## 2011-01-10 NOTE — Op Note (Signed)
NAME:  Barbara Snyder, Barbara Snyder                     ACCOUNT NO.:  [id number]   MEDICAL RECORD NO.:  [id number]                   PATIENT TYPE:  AMB   LOCATION:  DAY                                  FACILITY:  [facility]   PHYSICIAN:  R. Roetta Sessions, M.D.              DATE OF BIRTH:  11/25/1919   DATE OF PROCEDURE:  12/20/2002  DATE OF DISCHARGE:                                 OPERATIVE REPORT   PROCEDURE:  Screening colonoscopy.   ENDOSCOPIST:  Gerrit Friends. Rourk, M.D.   INDICATIONS FOR PROCEDURE:  The patient is an 77 year old lady seen at the  courtesy of Dr. Ouida Sills for colorectal cancer screening.  Family history  pertinent of her sister, who is 5 years older, for a history of colorectal  cancer.  Barbara Snyder has undergone a sigmoidoscopy previously by Dr. Ouida Sills  and no significant findings have been reported.  She has never had her  entire lower GI tract evaluated. She is devoid of any lower GI tract  symptoms.  Colonoscopy is now being done as a high risk screening maneuver.  This approach has been discussed with the patient at length.  The potential  risks, benefits, and alternatives have been reviewed, questions answered.  She is agreeable.  Please see my handwritten H&P for more information.   MONITORING:  O2 saturation, blood pressure, pulse and respirations were  monitored throughout the entire procedure.  Conscious sedation: Versed 2 mg IV, Demerol 25 mg IV.   INSTRUMENT:  Olympus video chip pediatric colonoscope.   FINDINGS:  Digital rectal exam revealed no abnormalities.   ENDOSCOPIC FINDINGS:  The prep was good.   RECTUM:  Examination of the rectal mucosa including the retroflex view of  the anal verge revealed only internal hemorrhoids.   COLON:  The colonic mucosa was surveyed from the rectosigmoid junction  through the left transverse and right colon to the area of the appendiceal  orifice, ileocecal valve, and cecum.  These structures were well seen and  photographed for the record.  The patient had a rather densely populated  left-sided diverticula.  The remainder of the colonic mucosa to the cecum  appeared normal.   From the level of the cecum and ileocecal valve the scope was slowly  withdrawn.  All previously mentioned mucosal surfaces were again seen.  No  other abnormalities were observed.  The patient tolerated the procedure well  and was reacted in endoscopy.   IMPRESSION:  1. Internal hemorrhoids otherwise normal rectum.  2. Left-sided diverticula.  3. The remainder of the colonic mucosa appeared normal.   RECOMMENDATIONS:  1. Would consider 1 more colonoscopy in 5 years if Barbara Snyder remains in     good health.  2. She is to follow up with Dr. Ouida Sills.  3. Diverticulosis literature provided to Barbara Snyder today.  Jonathon Bellows, M.D.    RMR/MEDQ  D:  12/20/2002  T:  12/20/2002  Job:  [id number]   cc:   Kingsley Callander. Ouida Sills, M.D.  [address]  [place]  [place] [address]  Fax: [phone]

## 2011-01-10 NOTE — Discharge Summary (Signed)
NAME:  Barbara Snyder, Barbara Snyder NO.:  [id number]   MEDICAL RECORD NO.:  [id number]          PATIENT TYPE:  INP   LOCATION:  A201                          FACILITY:  [facility]   PHYSICIAN:  Kingsley Callander. Ouida Sills, MD       DATE OF BIRTH:  [birth year]   DATE OF ADMISSION:  06/13/2006  DATE OF DISCHARGE:  10/23/2007LH                                 DISCHARGE SUMMARY   DISCHARGE DIAGNOSES:  1. Syncope  2. Scalp laceration  3. Right forearm fracture.  4. Coronary artery disease.  5. Primary hyperaldosteronism.  6. Nausea.  7. Normocytic anemia.  8. Hyponatremia.  9. Hypoalbuminemia.  10.Elevated amylase 199.   HOSPITAL COURSE:  This patient is an 77 year old female who presented to the  emergency room after a syncopal episode resulting in a blow to the left side  of her head.  She underwent a CT scan of the brain which revealed no sign of  subdural hematoma.  There was moderate generalized atrophy.  She was  hospitalized in a monitored setting.  Cardiac enzymes were negative.  She  underwent a carotid ultrasound, which revealed no significant stenosis.  She  complained of nausea.  She had mildly elevated amylase of 199.  Her AST was  initially elevated at 38 but a repeat was 29.  Her ALT and alkaline  phosphatase and bilirubin were normal.  She had a low albumin of 2.9.  She  was euthyroid with a TSH of 3.  She had a hemoglobin of 10.7 with an MCV of  98 with a repeat showing a hemoglobin of 11.6 with an MCV of 98.  Hyponatremic initially at 130, recheck was 132.  She has primary  hyperaldosteronism but had normal potassium at 4.2 and 4.8  There were no  cardiac arrhythmias on telemetry.  She underwent an ultrasound of her  abdomen for evaluation of her nausea and an elevated amylase and AST.  Cholelithiasis was present but there was no evidence of cholecystitis.  There was distal abdominal aortic ectasia measured at 2.4 cm.  Her chest x-  ray revealed no acute infiltrate.  She also  underwent an echocardiogram  which revealed normal regional and global LV systolic function.  There was  moderate calcification of the aortic annulus and the wall of the proximal  ascending aorta with mild dilatation.  There is moderate sclerosis of the  aortic valve leaflets with mild to moderate impairment of leaflet  separation.  Mild stenosis and insufficiency by Doppler.  There was mitral  valve calcification, which was mild and mild mitral annular calcification.  The atrial sizes were normal.  Her EKG revealed sinus rhythm with first-  degree AV block.  She had previously fallen, suffered a right forearm  fracture requiring surgery by Dr. Teressa Senter.  Her splint was left in place.  She will return to see Dr. Teressa Senter as scheduled.  There had been some  question as to her possibly taking additional doses of Darvocet and/or  Ambien prior to her syncopal episode.  She was encouraged to discontinue  each of these if possible.  This was discussed  with her son, Molly Maduro.   DISCHARGE MEDICATIONS:  1. Aspirin 325 mg daily.  2. Spironolactone 50 mg daily.  3. Lasix 40 mg daily.  4. Lipitor 40 mg daily  5. Lexapro 10 mg daily.  6. Keflex 500 mg q.i.d.  7. Tylenol Extra Strength two q.i.d. p.r.n.  8. Calcium with vitamin D b.i.d.  9. Multivitamin daily.  10.Toprol XL 25 mg daily.  11.Xanax 0.5 1/2-1 daily if needed.   As stated she will be seen by Dr. Teressa Senter in follow-up and have follow-up  with me in 2 weeks.      Kingsley Callander. Ouida Sills, MD  Electronically Signed     ROF/MEDQ  D:  07/05/2006  T:  07/05/2006  Job:  [id number]

## 2011-01-10 NOTE — Procedures (Signed)
[facility]  Patient:    Barbara Snyder, Barbara Snyder Visit Number: [id number] MRN: [id number]          Service Type: OUT Location: RAD Attending Physician:  Carylon Perches Dictated by:   Kari Baars, M.D. Proc. Date: 10/07/01 Admit Date:  09/20/2001 Discharge Date: 09/20/2001                      Pulmonary Function Test Inter.  RESULTS: 1. Spirometry is normal. 2. Lung volumes are borderline reduced. 3. DLCO moderately reduced. 4. Arterial blood gases show normal oxygenation.  The pCO2 is 44 which is    normal but toward the high side of normal. Dictated by:   Kari Baars, M.D. Attending Physician:  Carylon Perches DD:  10/07/01 TD:  10/08/01 Job: [id number]

## 2011-01-10 NOTE — H&P (Signed)
[place]  Patient:    Barbara Snyder, Barbara Snyder                  MRN: [id number] Adm. Date:  [id number] Disc. Date: [id number] Attending:  Veneda Melter Dictator:   Abelino Derrick, P.A.C. [facility] CC:         Veneda Melter, M.D. [facility]   History and Physical  HISTORY OF PRESENT ILLNESS:  Ms. Gilvin is a 77 year old female with no prior history of coronary disease, who was admitted to [facility] this morning with a two day history of chest pain and dyspnea on exertion. She states that two days ago while washing windows she became hot, broke out in a sweat and had substernal chest pain.  Since then the chest pain has been fairly constant and she has had increasing dyspnea on exertion.  She has had no change or improvement in her symptoms with aspirin.  On admission to [facility] Emergency Room, her EKG has mild nonspecific changes.  Her troponin was 18 with a CK of 255 and 17 MB.  It is likely she has had an out of hospital MI.   She is transferred now for further evaluation.  PAST MEDICAL HISTORY:  Remarkable for a long history of a heart murmur, she takes SBE prophylaxis for this and believes she might have had an echocardiogram in the past but it has been sometime.  She has gastroesophageal reflux disease symptoms.  She has had treated hypertension and previous hyperlipidemia.  She had a remote tonsillectomy and remote anal fissure repair.  She has had cataract implants.  HOME MEDICATIONS:  Include Aldactone, Lescol, aspirin and Protonix.  ALLERGIES:  No known drug allergies.  SOCIAL HISTORY:  She is married lives with her husband.  She worked at [employer] for eleven years; although, she says she was never a smoker.  FAMILY HISTORY:  Mother died at 84 of a MI.  Father died of prostate cancer in his 13s.  One sister has coronary disease and two brothers have had coronary disease, one of which has had bypass.  REVIEW OF SYSTEMS:   Essentially unremarkable except as noted above.  There is no history of peptic ulcer disease or GI bleeding.  She was treated remotely for thyroid but this has been stopped many years ago.  PHYSICAL EXAMINATION:  VITAL SIGNS:  Blood pressure 128/72, pulse 82, temperature 100.  GENERAL:  She is a well-developed well-nourished female in no acute distress.  HEENT:  Normocephalic.  Extraocular movements intact.  Sclerae nonicteric. Lids and conjunctivae are within normal limits.  NECK:  Without bruit, without JVD.  CHEST:  Essentially clear there are a few crackles in the left base.  CARDIAC:  Regular rate and rhythm with a 2/6 systolic murmur heard loudest at the left sternal border, it does radiate to the apex.  ABDOMEN:  Non-tender no hepatosplenomegaly.  EXTREMITIES:  Without edema.  Distal pulses are intact.  There are no femoral artery bruits noted.  NEUROLOGIC:  Grossly intact.  She is awake, alert, oriented and cooperative. Moves all extremities without obvious deficit.  SKIN: Warm and dry.  LABORATORY DATA:  EKG reveals normal sinus rhythm with suggestion of slight ST elevation at V2.  White count 8.8, hemoglobin 12.6, hematocrit 36,  platelet count 235,000, BUN 17, creatinine 1.4, sodium 127, potassium 4.6, troponin as noted is 18, INR 0.98.  Chest x-ray was sent with the patient, the results are pending.  IMPRESSION: 1.  Probable out of hospital myocardial infarction 47 hours old. 2. Systolic murmur, chronic, this sounds like MR. 3. Treated hypertension. 4. Treated hyperlipidemia. 5. Family history of coronary disease. 6. History of gastroesophageal reflux symptoms.  PLAN:  Will continue Heparin, aspirin.  Add beta blocker and nitrates.  Check a 2D echo Monday and consider for cardiac catheterization Monday. DD:  05/30/00 TD:  05/31/00 Job: [id number]

## 2011-04-04 ENCOUNTER — Encounter: Payer: Self-pay | Admitting: *Deleted

## 2011-04-04 ENCOUNTER — Emergency Department (HOSPITAL_COMMUNITY)
Admission: EM | Admit: 2011-04-04 | Discharge: 2011-04-04 | Disposition: A | Discharge status: Home / Self Care | Payer: Medicare Other | Attending: Emergency Medicine | Admitting: Emergency Medicine

## 2011-04-04 ENCOUNTER — Emergency Department (HOSPITAL_COMMUNITY): Payer: Medicare Other

## 2011-04-04 DIAGNOSIS — Z7982 Long term (current) use of aspirin: Secondary | ICD-10-CM | POA: Insufficient documentation

## 2011-04-04 DIAGNOSIS — G309 Alzheimer's disease, unspecified: Secondary | ICD-10-CM | POA: Insufficient documentation

## 2011-04-04 DIAGNOSIS — I509 Heart failure, unspecified: Secondary | ICD-10-CM | POA: Insufficient documentation

## 2011-04-04 DIAGNOSIS — W010XXA Fall on same level from slipping, tripping and stumbling without subsequent striking against object, initial encounter: Secondary | ICD-10-CM | POA: Insufficient documentation

## 2011-04-04 DIAGNOSIS — S0990XA Unspecified injury of head, initial encounter: Secondary | ICD-10-CM

## 2011-04-04 DIAGNOSIS — F0280 Dementia in other diseases classified elsewhere without behavioral disturbance: Secondary | ICD-10-CM | POA: Insufficient documentation

## 2011-04-04 DIAGNOSIS — S0003XA Contusion of scalp, initial encounter: Secondary | ICD-10-CM | POA: Insufficient documentation

## 2011-04-04 DIAGNOSIS — IMO0002 Reserved for concepts with insufficient information to code with codable children: Secondary | ICD-10-CM | POA: Insufficient documentation

## 2011-04-04 DIAGNOSIS — Y92009 Unspecified place in unspecified non-institutional (private) residence as the place of occurrence of the external cause: Secondary | ICD-10-CM | POA: Insufficient documentation

## 2011-04-04 DIAGNOSIS — I252 Old myocardial infarction: Secondary | ICD-10-CM | POA: Insufficient documentation

## 2011-04-04 DIAGNOSIS — I10 Essential (primary) hypertension: Secondary | ICD-10-CM | POA: Insufficient documentation

## 2011-04-04 HISTORY — DX: Dementia in other diseases classified elsewhere, unspecified severity, without behavioral disturbance, psychotic disturbance, mood disturbance, and anxiety: F02.80

## 2011-04-04 HISTORY — DX: Acute myocardial infarction, unspecified: I21.9

## 2011-04-04 HISTORY — DX: Disorder of thyroid, unspecified: E07.9

## 2011-04-04 HISTORY — DX: Heart failure, unspecified: I50.9

## 2011-04-04 HISTORY — DX: Essential (primary) hypertension: I10

## 2011-04-04 HISTORY — DX: Unspecified osteoarthritis, unspecified site: M19.90

## 2011-04-04 HISTORY — DX: Alzheimer's disease, unspecified: G30.9

## 2011-04-04 MED ORDER — BACITRACIN ZINC 500 UNIT/GM EX OINT
TOPICAL_OINTMENT | CUTANEOUS | Status: AC
  Administered 2011-04-04: 1 via TOPICAL
  Filled 2011-04-04: qty 0.9

## 2011-04-04 NOTE — ED Provider Notes (Signed)
History   Scribed for Barbara Bonier, MD, the patient was seen in room APA06/APA06 . This chart was scribed by Desma Paganini. This patient's care was started at 10:56 AM .   CSN: [id number] Arrival date & time: 04/04/2011 10:02 AM  Chief Complaint  Patient presents with  . Fall   The history is provided by the patient.   Barbara Snyder is a 77 y.o. female who presents to the Emergency Department complaining of fall against the wall while bathing herself at home just prior to arrival. Normally she has some help bathing at home but she was attempting to do it herself today. The physical challenge for her was getting out of the tub and that's when she had the mechanical slip and fall onto the right back of her head. She complains of some bleeding with hematoma over that area. She denies LOC, current HA, nausea, vomiting.   PAST MEDICAL HISTORY:  Past Medical History  Diagnosis Date  . Alzheimer disease   . Hypertension   . CHF (congestive heart failure)   . Arthritis   . Thyroid disease   . Myocardial infarct      PAST SURGICAL HISTORY:  Past Surgical History  Procedure Date  . Mastoidectomy      MEDICATIONS:   Previous Medications   ASPIRIN 81 MG EC TABLET    Take 81 mg by mouth daily.     FUROSEMIDE (LASIX) 40 MG TABLET    Take 40 mg by mouth daily.     METOPROLOL TARTRATE (LOPRESSOR) 25 MG TABLET    Take 12.5 mg by mouth 2 (two) times daily.     MIRTAZAPINE (REMERON) 15 MG TABLET    Take 7.5 mg by mouth at bedtime.     MULTIPLE VITAMINS-MINERALS (MULTIVITAMIN WITH MINERALS) TABLET    Take 1 tablet by mouth daily.     SPIRONOLACTONE (ALDACTONE) 25 MG TABLET    Take 25 mg by mouth daily.       ALLERGIES:   Allergies as of 04/04/2011  . (No Known Allergies)     FAMILY HISTORY:   No family history on file.   SOCIAL HISTORY:  History   Social History  . Marital Status: Widowed    Spouse Name: N/A    Number of Children: N/A  . Years of Education: N/A    Social History Main Topics  . Smoking status: Never Smoker   . Smokeless tobacco: None  . Alcohol Use: No  . Drug Use: No  . Sexually Active:    Other Topics Concern  . None   Social History Narrative  . None     Review of Systems  HENT: Negative for neck pain.   Musculoskeletal: Negative for back pain.   10 Systems reviewed and are negative for acute change except as noted in the HPI.  Physical Exam  BP 92/55  Pulse 74  Temp(Src) 98.2 F (36.8 C) (Oral)  Resp 16  Ht 4\' 10"  (1.473 m)  Wt 100 lb (45.36 kg)  BMI 20.90 kg/m2  SpO2 97%  Physical Exam  Constitutional: She is oriented to person, place, and time. No distress.       Elderly and frail appearing  HENT:       Superficial cut overlying hematoma right occiput  Cardiovascular: Normal rate, regular rhythm and normal heart sounds.   Pulmonary/Chest: Effort normal and breath sounds normal. No respiratory distress.  Abdominal: Soft. Bowel sounds are normal. She exhibits no distension. There  is no tenderness.  Musculoskeletal:       No tenderness of the process of the lumbar, thoracic or cervical spine. Small skin tear abrasion over extensor surface of right elbow, full range of motion, no deformity to upper extremities otherwise. Pelvis stable and non tender to palpation and passive ROM. No deformity or tenderness to lower extremities.  Neurological: She is alert and oriented to person, place, and time.  Skin: Skin is warm and dry.  Psychiatric: She has a normal mood and affect. Her behavior is normal.    ED Course  Procedures  OTHER DATA REVIEWED: Nursing notes, vital signs, and past medical records reviewed.    DIAGNOSTIC STUDIES: Oxygen Saturation is 97% on room air, normal by my interpretation.     LABS / RADIOLOGY:   XR CT scan of the patient's head shows scalp hematoma without calvarial fracture, in no acute intracranial injury or bleeding.    ED COURSE / COORDINATION OF CARE: 10:47. Initial  orders placed prior to my assessment include CT head   MDM: Differential Diagnosis: closed head injury versus minor head injury with hematoma to the scalp but I don't suspect injury to the neck back or extremities based on my exam   IMPRESSION: Minor head injury with scalp hematoma, and no intracranial injury, with mild small skin tear to right elbow, but no apparent injuries to the neck, back, or extremities.    PLAN:  Home The patient is to return the emergency department if there is any worsening of symptoms. I have reviewed the discharge instructions with the patient   CONDITION ON DISCHARGE: Good   MEDICATIONS GIVEN IN THE E.D.  Medications  aspirin 81 MG EC tablet (not administered)  Multiple Vitamins-Minerals (MULTIVITAMIN WITH MINERALS) tablet (not administered)  metoprolol tartrate (LOPRESSOR) 25 MG tablet (not administered)  furosemide (LASIX) 40 MG tablet (not administered)  spironolactone (ALDACTONE) 25 MG tablet (not administered)  mirtazapine (REMERON) 15 MG tablet (not administered)     DISCHARGE MEDICATIONS: New Prescriptions   No medications on file     Scribe Attestation I personally performed the services described in this documentation, which was scribed in my presence. The recorded information has been reviewed and considered.       Barbara Bonier, MD 04/04/11 [phone]

## 2011-04-04 NOTE — ED Notes (Signed)
Was taking a bath and slipped off of step hitting back of head on edge of bathtub.  Pt alert and oriented, denies dizziness/lightheadedness/visual disturbances.

## 2011-04-04 NOTE — ED Notes (Signed)
Assisted patient to walk to restroom.

## 2011-04-04 NOTE — ED Notes (Signed)
Pt was getting out of bath tub this morning and fell hitting head on back of tub. Area cleaned. Large size knot noted to back of head with superficial abrasion. Bleeding controlled. Pt a/o x3. C/o slight headache. Denies any dizziness. Neuro negative.

## 2011-06-12 ENCOUNTER — Encounter: Payer: Self-pay | Admitting: Gastroenterology

## 2011-06-12 ENCOUNTER — Ambulatory Visit: Payer: Medicare Other | Admitting: Urgent Care

## 2011-06-12 ENCOUNTER — Ambulatory Visit (INDEPENDENT_AMBULATORY_CARE_PROVIDER_SITE_OTHER): Payer: Medicare Other | Admitting: Gastroenterology

## 2011-06-12 VITALS — BP 110/80 | HR 64 | Temp 97.4°F | Ht 60.0 in | Wt 93.6 lb

## 2011-06-12 DIAGNOSIS — K921 Melena: Secondary | ICD-10-CM

## 2011-06-12 MED ORDER — HYDROCORTISONE ACETATE 25 MG RE SUPP: 25.0000 mg | RECTAL | Status: DC

## 2011-06-12 NOTE — Progress Notes (Signed)
Referring Provider: Carylon Perches, MD Primary Care Physician:  Carylon Perches, MD Primary Gastroenterologist:  Dr. Jena Gauss   Chief Complaint  Patient presents with  . blood on underwear x 1    HPI:   Barbara Snyder is a pleasant 77 year old petite lady who presents today with her son at the request of Dr. Ouida Sills due to an incidence of hematochezia. She last had a colonoscopy in April 2004 with left-sided diverticula and internal hemorrhoids. At that time, it was considered feasible to repeat in 5 years if she were still in good health. She presents today denying abdominal pain, nausea or vomiting. She has had no change in bowel habits. Reports a BM every day. Son says she "eats like a bird". Wt ranges between 90-100 lbs. Son states caretaker saw a wine-colored smear in her underwear about a week ago. Dr. Alonza Smoker note reports heme + stool.   CBC on 10/15: no anemia, Hgb 13.  Sister and nephew with history of colon cancer.   Past Medical History  Diagnosis Date  . Alzheimer disease   . Hypertension   . CHF (congestive heart failure)   . Arthritis   . Thyroid disease   . Myocardial infarct 2001  . S/P colonoscopy Apr 2004    left-sided diverticula, internal hemorrhoids (RMR)    Past Surgical History  Procedure Date  . Mastoidectomy     Current Outpatient Prescriptions  Medication Sig Dispense Refill  . furosemide (LASIX) 40 MG tablet Take 40 mg by mouth daily.        . metoprolol tartrate (LOPRESSOR) 25 MG tablet Take 12.5 mg by mouth 2 (two) times daily.        . mirtazapine (REMERON) 15 MG tablet Take 7.5 mg by mouth at bedtime.        . Multiple Vitamins-Minerals (MULTIVITAMIN WITH MINERALS) tablet Take 1 tablet by mouth daily.        Marland Kitchen spironolactone (ALDACTONE) 25 MG tablet Take 25 mg by mouth daily.        Marland Kitchen aspirin 81 MG EC tablet Take 81 mg by mouth daily.        . hydrocortisone (ANUSOL-HC) 25 MG suppository Place 1 suppository (25 mg total) rectally 1 day or 1 dose. Place 1  suppository in rectum each night for 7 days.  7 suppository  0    Allergies as of 06/12/2011  . (No Known Allergies)    Family History  Problem Relation Age of Onset  . Colon cancer Sister     Stage II, in remission  . Colon cancer      nephew; Stage IV    History   Social History  . Marital Status: Widowed    Spouse Name: N/A    Number of Children: N/A  . Years of Education: N/A   Occupational History  . Not on file.   Social History Main Topics  . Smoking status: Never Smoker   . Smokeless tobacco: Not on file  . Alcohol Use: No  . Drug Use: No  . Sexually Active:    Other Topics Concern  . Not on file   Social History Narrative  . No narrative on file    Review of Systems: Gen: Denies any fever, chills, loss of appetite, fatigue, weight loss. CV: Denies chest pain, heart palpitations, syncope, peripheral edema. Resp: Denies shortness of breath with rest, cough, wheezing GI: Denies dysphagia or odynophagia. Denies hematemesis, fecal incontinence, or jaundice.  GU : Denies urinary burning, urinary frequency, urinary  incontinence.  MS: Denies joint pain, muscle weakness, cramps, limited movement Derm: Denies rash, itching, dry skin Psych: Denies depression, anxiety, confusion or memory loss  Heme: Denies bruising, bleeding, and enlarged lymph nodes.  Physical Exam: BP 110/80  Pulse 64  Temp(Src) 97.4 F (36.3 C) (Tympanic)  Ht 5' (1.524 m)  Wt 93 lb 9.6 oz (42.457 kg)  BMI 18.28 kg/m2 General:   Alert and oriented. Petite, ambulates with assistance.  Head:  Normocephalic and atraumatic. Eyes:  Conjunctiva pink, sclera clear, no icterus.   Conjunctiva pink. Ears:  HOH Nose:  No deformity, discharge,  or lesions. Mouth:  No deformity or lesions, mucosa pink and moist.  Neck:  Supple, without mass or thyromegaly. Lungs:  Clear to auscultation bilaterally, without wheezing, rales, or rhonchi.  Heart:  S1, S2 present without murmurs noted.  Abdomen:   +BS, soft, non-tender and non-distended. Without mass or HSM. No rebound or guarding. No hernias noted. Rectal:  No external hemorrhoids noted, rectal exam with decent sphincter tone, no stool found on glove, no pain or discomfort Msk:  Symmetrical without gross deformities. Kyphosis Extremities:  Without clubbing or edema. Neurologic:  Alert and  oriented to person, tends to repeat the same thing at times Skin:  Intact, warm and dry without significant lesions or rashes Cervical Nodes:  No significant cervical adenopathy. Psych:  Alert and cooperative. Normal mood and affect.

## 2011-06-12 NOTE — Patient Instructions (Signed)
Use the suppositories prescribed, 1 each evening for 1 week.  We will see you back in 4 weeks to decide if we need to proceed with a colonoscopy.

## 2011-06-16 DIAGNOSIS — K921 Melena: Secondary | ICD-10-CM

## 2011-06-16 HISTORY — DX: Melena: K92.1

## 2011-06-16 NOTE — Assessment & Plan Note (Signed)
77 year old female with once incidence of wine-colored smear in her underwear, found by caretaker. No further evidence of bleeding; pt is asymptomatic with no anemia on CBC. She denies abdominal pain, change in bowel habits, nausea or vomiting. She does have a family history of colon cancer in a first-degree relative, her sister. Her last colonoscopy was in 2004, with plans to repeat in 2009 if pt were still in good health. She does have a hx of internal hemorrhoids. At this time, her son was present with her in the room. I discussed at length the risks and benefits of proceeding with such a procedure at this time, given her current state of health. She does appear somewhat frail at time of visit, but she ambulates well with assistance and seems to be overall doing fairly well for her age and comorbidities. Her son seemed to want to hold off on an invasive procedure at the moment, and I agree this may be the best option for her at this time. As she is not continuing to have evidence of hematochezia or other physical signs, I'd like to give her Anusol suppositories for 1 week and have her return in 4 weeks for reevaluation. I did inform the son that if anything at all changed with her status to contact our office. We will contemplate a possible colonoscopy at the next visit. As she does have a FH of colon cancer, I'd like to keep a close eye on her.   Anusol suppositories F/U in 4 weeks

## 2011-06-17 NOTE — Progress Notes (Signed)
Cc to PCP 

## 2011-07-10 ENCOUNTER — Encounter: Payer: Self-pay | Admitting: Gastroenterology

## 2011-07-10 ENCOUNTER — Ambulatory Visit (INDEPENDENT_AMBULATORY_CARE_PROVIDER_SITE_OTHER): Payer: Medicare Other | Admitting: Gastroenterology

## 2011-07-10 VITALS — BP 115/74 | HR 77 | Temp 97.6°F | Ht 60.0 in | Wt 95.0 lb

## 2011-07-10 DIAGNOSIS — K921 Melena: Secondary | ICD-10-CM

## 2011-07-10 NOTE — Progress Notes (Signed)
Referring Provider: Carylon Perches, MD Primary Care Physician:  Carylon Perches, MD Primary Gastroenterologist: Dr. Jena Gauss   Chief Complaint  Patient presents with  . Follow-up  . Rectal Bleeding    HPI:   Barbara Snyder is a pleasant 77 year old female who presents today for a 4 week follow-up. She was originally seen due to low-volume hematochezia a few weeks ago, and she was given anusol suppositories for 1 week. Her last colonoscopy was in 2004; she has a sister who was diagnosed with colon cancer in the past as well as a nephew. She was due for an updated colonoscopy in 2009 if health permitted.  She is here today with her son, who lives with her. He reports continued intermittent hematochezia, "smears" in underwear. However, son states it seems to be getting better. She denies any abdominal pain, change in bowel habits. Son reports a good appetite. She is up 2 lbs from last visit. She is in fairly good health, able to ambulate with mild assistance. The son seems to want to hold off on a colonoscopy at this point.  Past Medical History  Diagnosis Date  . Alzheimer disease   . Hypertension   . CHF (congestive heart failure)   . Arthritis   . Thyroid disease   . Myocardial infarct 2001  . S/P colonoscopy Apr 2004    left-sided diverticula, internal hemorrhoids (RMR)  . Hip pain   . Skin rash   . Cholesterol blood decreased   . Trouble in sleeping   . Asthma     Past Surgical History  Procedure Date  . Mastoidectomy   . Colonoscopy 12/20/02    internal hemorrhoids otherwise normal/left-side diverticula    Current Outpatient Prescriptions  Medication Sig Dispense Refill  . aspirin 81 MG EC tablet Take 81 mg by mouth daily.        . furosemide (LASIX) 40 MG tablet Take 40 mg by mouth daily.        . hydrocortisone (ANUSOL-HC) 25 MG suppository Place 1 suppository (25 mg total) rectally 1 day or 1 dose. Place 1 suppository in rectum each night for 7 days.  7 suppository  0  .  metoprolol tartrate (LOPRESSOR) 25 MG tablet Take 12.5 mg by mouth 2 (two) times daily.        . mirtazapine (REMERON) 15 MG tablet Take 7.5 mg by mouth at bedtime.        . Multiple Vitamins-Minerals (MULTIVITAMIN WITH MINERALS) tablet Take 1 tablet by mouth daily.        Marland Kitchen spironolactone (ALDACTONE) 25 MG tablet Take 25 mg by mouth daily.          Allergies as of 07/10/2011  . (No Known Allergies)    Family History  Problem Relation Age of Onset  . Colon cancer Sister     Stage II, in remission  . Colon cancer      nephew; Stage IV    History   Social History  . Marital Status: Widowed    Spouse Name: N/A    Number of Children: N/A  . Years of Education: N/A   Social History Main Topics  . Smoking status: Never Smoker   . Smokeless tobacco: None  . Alcohol Use: No  . Drug Use: No  . Sexually Active:    Other Topics Concern  . None   Social History Narrative  . None    Review of Systems: Gen: Denies fever, chills, anorexia. Denies fatigue, weakness, weight loss.  CV:  Denies chest pain, palpitations, syncope, peripheral edema, and claudication. Resp: Denies dyspnea at rest, cough, wheezing, coughing up blood, and pleurisy. GI: Denies vomiting blood, jaundice, and fecal incontinence.   Denies dysphagia or odynophagia. Derm: Denies rash, itching, dry skin Psych: Denies depression, anxiety, memory loss, confusion. No homicidal or suicidal ideation.  Heme: Denies bruising, bleeding, and enlarged lymph nodes.  Physical Exam: BP 115/74  Pulse 77  Temp(Src) 97.6 F (36.4 C) (Temporal)  Ht 5' (1.524 m)  Wt 95 lb (43.092 kg)  BMI 18.55 kg/m2 General:   Alert and oriented to person, place, situation. No distress noted. Pleasant and cooperative.  Head:  Normocephalic and atraumatic. Eyes:  Conjuctiva clear without scleral icterus. Mouth:  Oral mucosa pink and moist. Good dentition. No lesions. Heart:  S1, S2 present without murmurs, rubs, or gallops. Regular rate and  rhythm. Abdomen:  +BS, soft, non-tender and non-distended. No rebound or guarding. No HSM or masses noted. Msk:  Symmetrical without gross deformities. Stooped posture, kyphosis.  Extremities:  Without edema. Skin:  Intact without significant lesions or rashes. Psych:  Alert and cooperative. Normal mood and affect.

## 2011-07-10 NOTE — Patient Instructions (Signed)
I will be talking to Dr. Jena Gauss regarding possibly pursuing a colonoscopy.  We will be in touch shortly!!  Watch for any abdominal pain, change in bowel habits, or increased blood.

## 2011-07-13 NOTE — Assessment & Plan Note (Addendum)
77 year old female with low-volume hematochezia intermittently, no anemia on prior CBC. No other signs such as abdominal pain, change in bowel habits. +FH of colon cancer in first-degree relative, her sister. Last colonoscopy in 2004 with plans to repeat in 2009 if still in good hx. She was given a course of Anusol suppositories at the last visit; son states she continues to have intermittent smears in underwear. Her son seems hesitant to pursue any invasive procedure at this time. She is quite petite, but she seems to be in overall good health for her age and comorbidities. Due to her family history, I will discuss with Dr. Jena Gauss proceeding with colonoscopy versus carefully observing for any other changes. The son was agreeable to this as well. Further recommendations to follow in near future.   Addendum 11/26: Discussed with Dr. Jena Gauss. Recommend colonoscopy. Will inform pt and son.

## 2011-07-14 NOTE — Progress Notes (Signed)
Cc to PCP 

## 2011-07-23 NOTE — Progress Notes (Signed)
Tried to call pts son- Molly Maduro about tcs, LM for return call.

## 2011-07-28 ENCOUNTER — Other Ambulatory Visit: Payer: Self-pay | Admitting: Gastroenterology

## 2011-07-28 DIAGNOSIS — K921 Melena: Secondary | ICD-10-CM

## 2011-07-28 NOTE — Progress Notes (Signed)
Barbara Snyder tried to call pt's son today and didn't get an answer.

## 2011-07-28 NOTE — Progress Notes (Unsigned)
Spoke w/ pts son- pt is tentatively scheduled for 09/03/11- he wants to make sure this colonoscopy is completely necessary due to her age- he also said that she had not had any more episodes of bleeding  ----- Message ----- From: Evalee Mutton, LPN Sent: 78/09/9560 11:45 AM To: Hollice Espy, I have tried to contact this pts son to inform him but have been unable to reach them. There is an addendum to her note but i cant send it to anyone, i can only document on it. Will you try to call them as well. Thanks ----- Message ----- From: Gerrit Halls, NP Sent: 07/21/2011 12:32 PM To: Evalee Mutton, LPN  Hi! Talked with Dr. Jena Gauss. He recommended a colnooscopy. I cc'ed my addendum to you. Thanks!

## 2011-07-28 NOTE — Progress Notes (Unsigned)
What does the patient want to do? This is ultimately her decision. She is above average risk secondary to a first degree relative actually diagnosed with colon cancer. She has had symptoms of rectal bleeding, and she is in decent health to undergo a colonoscopy. I have discussed this with Dr. Jena Gauss, and we would be remiss to not recommend a colonoscopy. At the visit, I spent a very long time talking with the son about this procedure. He stated he wanted to hear what Dr. Jena Gauss had to say before scheduling anything. So, he now knows what our practice is recommending. I am unsure why he is hesitant about this. Can we find this out?

## 2011-07-28 NOTE — Progress Notes (Unsigned)
pts son doesn't want her to have this procedure done.

## 2011-07-28 NOTE — Progress Notes (Signed)
pts son aware. He is going to talk with Crystal about getting her scheduled.

## 2011-08-13 NOTE — Progress Notes (Unsigned)
Called pt's home. She was confused and did not remember talking about a colonoscopy. Will need to try again to talk with son.

## 2011-08-20 NOTE — Progress Notes (Unsigned)
Called pt's home again. She is very hard of hearing. Son not available at that time. Called the number listed for the son. No VM service.  Please send letter to patient and son regarding upcoming procedure, as they have concerns about proceeding with this. We need them to call our office as soon as they can.

## 2011-08-29 ENCOUNTER — Encounter (HOSPITAL_COMMUNITY): Payer: Self-pay | Admitting: Pharmacy Technician

## 2011-09-03 ENCOUNTER — Ambulatory Visit (HOSPITAL_COMMUNITY): Admission: RE | Admit: 2011-09-03 | Payer: Medicare Other | Source: Ambulatory Visit | Admitting: Internal Medicine

## 2011-09-03 ENCOUNTER — Encounter (HOSPITAL_COMMUNITY): Admission: RE | Payer: Self-pay | Source: Ambulatory Visit

## 2011-09-03 SURGERY — COLONOSCOPY
Anesthesia: Moderate Sedation

## 2011-09-11 ENCOUNTER — Ambulatory Visit: Payer: Medicare Other | Admitting: Gastroenterology

## 2011-09-18 ENCOUNTER — Encounter: Payer: Self-pay | Admitting: Gastroenterology

## 2011-09-18 ENCOUNTER — Ambulatory Visit (INDEPENDENT_AMBULATORY_CARE_PROVIDER_SITE_OTHER): Payer: Medicare Other | Admitting: Gastroenterology

## 2011-09-18 VITALS — BP 121/71 | HR 77 | Temp 97.0°F | Ht 60.0 in | Wt 92.4 lb

## 2011-09-18 DIAGNOSIS — K625 Hemorrhage of anus and rectum: Secondary | ICD-10-CM

## 2011-09-18 MED ORDER — SOD PICOSULFATE-MAG OX-CIT ACD 10-3.5-12 MG-GM-GM PO PACK: 1.0000 | PACK | Freq: Once | ORAL | Status: DC

## 2011-09-18 NOTE — Patient Instructions (Signed)
We have set you up for a colonoscopy with Dr. Jena Gauss in February.   Please call our office if anything changes in the meantime.

## 2011-09-19 ENCOUNTER — Telehealth: Payer: Self-pay

## 2011-09-19 DIAGNOSIS — K625 Hemorrhage of anus and rectum: Secondary | ICD-10-CM | POA: Insufficient documentation

## 2011-09-19 NOTE — Telephone Encounter (Signed)
Barbara Snyder called and Prepopik not covered by insurance. He requested me send Trilyte prep script. Told him I would check with Tobi Bastos to see now many days prep pt needed. Pt not scheduled until 10/16/2011.

## 2011-09-19 NOTE — Progress Notes (Signed)
Referring Provider: Carylon Perches, MD Primary Care Physician:  Carylon Perches, MD, MD Primary Gastroenterologist: Dr. Jena Gauss   Chief Complaint  Patient presents with  . Colonoscopy    HPI:   Barbara Snyder is a 78 year old lady who returns today for reconsideration of colonoscopy. She is accompanied by her son today. I originally saw her in October due to low-volume hematochezia, and she was treated with a short course of anusol suppositories. Last TCS had been in 2004 with plans to repeat in 2009. Sister diagnosed with colon cancer in past as well as nephew. In November, son continued to report "smears" in underwear. He was hesitant to proceed with a colonoscopy at that time, and he continued to say "we will proceed if this is recommended by Dr. Jena Gauss". I reviewed this with RMR, and it was decided that due to clinical signs of rectal bleeding and positive FH of colon cancer, a colonoscopy should be offered. I informed the son of this, and it was scheduled. However, they cancelled shortly thereafter, stating he did not want to proceed.  They have returned today, and the son continues to ask me what our recommendations are. It is quite clear in previous notes and phone notes that we have recommended a colonoscopy. He at times seems somewhat brisk with his mother, and I had a long discussion with them regarding the risks and benefits. He states he is worried about the cost of this, and states he hates to "bring money into it". Again, I told them this was up to the patient, Barbara Snyder, if she wanted to proceed. She stated she would, and her son states he would feel more comfortable if we proceeded as well. He is the power of attorney.  She denies any abdominal pain, change in bowel habits, and she has not noted any recent hematochezia.   Past Medical History  Diagnosis Date  . Alzheimer disease   . Hypertension   . CHF (congestive heart failure)   . Arthritis   . Thyroid disease   . Myocardial infarct  2001  . S/P colonoscopy Apr 2004    left-sided diverticula, internal hemorrhoids (RMR)  . Hip pain   . Skin rash   . Cholesterol blood decreased   . Trouble in sleeping   . Asthma     Past Surgical History  Procedure Date  . Mastoidectomy   . Colonoscopy 12/20/02    internal hemorrhoids otherwise normal/left-side diverticula    Current Outpatient Prescriptions  Medication Sig Dispense Refill  . furosemide (LASIX) 40 MG tablet Take 20 mg by mouth daily.       . metoprolol tartrate (LOPRESSOR) 25 MG tablet Take 12.5 mg by mouth 2 (two) times daily.        . mirtazapine (REMERON) 15 MG tablet Take 7.5 mg by mouth at bedtime.        . Multiple Vitamins-Minerals (MULTIVITAMIN WITH MINERALS) tablet Take 1 tablet by mouth daily.        Marland Kitchen spironolactone (ALDACTONE) 25 MG tablet Take 25 mg by mouth daily.        . Sod Picosulfate-Mag Ox-Cit Acd (PREPOPIK) 10-3.5-12 MG-GM-GM PACK Take 1 kit by mouth once.  1 each  0    Allergies as of 09/18/2011  . (No Known Allergies)    Family History  Problem Relation Age of Onset  . Colon cancer Sister     Stage II, in remission  . Colon cancer      nephew; Stage IV  History   Social History  . Marital Status: Widowed    Spouse Name: N/A    Number of Children: N/A  . Years of Education: N/A   Social History Main Topics  . Smoking status: Never Smoker   . Smokeless tobacco: None  . Alcohol Use: No  . Drug Use: No  . Sexually Active:    Other Topics Concern  . None   Social History Narrative  . None    Review of Systems: Gen: Denies fever, chills, anorexia. Denies fatigue, weakness, weight loss.  CV: Denies chest pain, palpitations, syncope, peripheral edema, and claudication. Resp: Denies dyspnea at rest, cough, wheezing, coughing up blood, and pleurisy. GI: Denies vomiting blood, jaundice, and fecal incontinence.   Denies dysphagia or odynophagia. Derm: Denies rash, itching, dry skin Psych: Denies depression, anxiety    Heme: Denies bruising, bleeding, and enlarged lymph nodes.  Physical Exam: BP 121/71  Pulse 77  Temp(Src) 97 F (36.1 C) (Temporal)  Ht 5' (1.524 m)  Wt 92 lb 6.4 oz (41.912 kg)  BMI 18.05 kg/m2 General:   Alert and oriented. No distress noted. Pleasant and cooperative. Petite.  Head:  Normocephalic and atraumatic. Eyes:  Conjuctiva clear without scleral icterus. Mouth:  Oral mucosa pink and moist. Good dentition. No lesions. Neck:  Supple, without mass or thyromegaly. Heart:  S1, S2 present without murmurs, rubs, or gallops. Regular rate and rhythm. Abdomen:  +BS, soft, non-tender and non-distended. No rebound or guarding. No HSM or masses noted. Msk:  Symmetrical without gross deformities. Normal posture. Extremities:  Without edema. Neurologic:  Alert and  oriented x4;  grossly normal neurologically. Skin:  Intact without significant lesions or rashes. Cervical Nodes:  No significant cervical adenopathy. Psych:  Alert and cooperative. Normal mood and affect.

## 2011-09-19 NOTE — Assessment & Plan Note (Signed)
78 year old female, petite in stature but in overall good health for age, presenting with recent low-volume hematochezia. Treated with short course of anusol suppositories after first consultation with [place], with continued intermittent "smears" in underwear. No other symptoms such as change in bowel habits, abdominal pain, etc. Son is power of attorney, and he had been agreeable to proceeding with a colonoscopy in November. This was cancelled by him as he had changed his mind. Please see HPI for full details. At this visit, he states he has thought about proceeding and feels this is the best. Pt does have sister who was diagnosed with colon cancer in the past. The patient is willing to proceed as well.  Again, I spent at least 20 minutes discussing the procedure and ensuring that both the patient and son were comfortable proceeding at this time. I have asked that they contact our office if they choose not to proceed, but at this time, they would like to be scheduled.   Proceed with TCS with Dr. Jena Gauss in near future: the risks, benefits, and alternatives have been discussed with the patient in detail. The patient states understanding and desires to proceed. Son is power of attorney, and he is her primary caretaker.

## 2011-09-22 NOTE — Telephone Encounter (Signed)
Trilyte fine. Standard prep dosing. Thanks!

## 2011-09-22 NOTE — Progress Notes (Signed)
Faxed to PCP

## 2011-09-22 NOTE — Telephone Encounter (Signed)
Rx for [employer] and instructions faxed to [employer].

## 2011-10-15 MED ORDER — SODIUM CHLORIDE 0.45 % IV SOLN
Freq: Once | INTRAVENOUS | Status: AC
  Administered 2011-10-16: 1000 mL via INTRAVENOUS

## 2011-10-16 ENCOUNTER — Ambulatory Visit (HOSPITAL_COMMUNITY)
Admission: RE | Admit: 2011-10-16 | Discharge: 2011-10-16 | Disposition: A | Discharge status: Home / Self Care | Payer: Medicare Other | Source: Ambulatory Visit | Attending: Internal Medicine | Admitting: Internal Medicine

## 2011-10-16 ENCOUNTER — Encounter (HOSPITAL_COMMUNITY)
Admission: RE | Disposition: A | Discharge status: Home / Self Care | Payer: Self-pay | Source: Ambulatory Visit | Attending: Internal Medicine

## 2011-10-16 ENCOUNTER — Encounter (HOSPITAL_COMMUNITY): Payer: Self-pay | Admitting: *Deleted

## 2011-10-16 ENCOUNTER — Other Ambulatory Visit: Payer: Self-pay | Admitting: Internal Medicine

## 2011-10-16 DIAGNOSIS — K921 Melena: Secondary | ICD-10-CM | POA: Insufficient documentation

## 2011-10-16 DIAGNOSIS — Z800 Family history of malignant neoplasm of digestive organs: Secondary | ICD-10-CM | POA: Insufficient documentation

## 2011-10-16 DIAGNOSIS — K5730 Diverticulosis of large intestine without perforation or abscess without bleeding: Secondary | ICD-10-CM | POA: Insufficient documentation

## 2011-10-16 DIAGNOSIS — I10 Essential (primary) hypertension: Secondary | ICD-10-CM | POA: Insufficient documentation

## 2011-10-16 DIAGNOSIS — D126 Benign neoplasm of colon, unspecified: Secondary | ICD-10-CM

## 2011-10-16 DIAGNOSIS — K648 Other hemorrhoids: Secondary | ICD-10-CM | POA: Insufficient documentation

## 2011-10-16 DIAGNOSIS — K625 Hemorrhage of anus and rectum: Secondary | ICD-10-CM

## 2011-10-16 DIAGNOSIS — Z79899 Other long term (current) drug therapy: Secondary | ICD-10-CM | POA: Insufficient documentation

## 2011-10-16 HISTORY — DX: Anxiety disorder, unspecified: F41.9

## 2011-10-16 HISTORY — DX: Atherosclerotic heart disease of native coronary artery without angina pectoris: I25.10

## 2011-10-16 HISTORY — PX: COLONOSCOPY: SHX5424

## 2011-10-16 SURGERY — COLONOSCOPY
Anesthesia: Moderate Sedation

## 2011-10-16 MED ORDER — HYDROCORTISONE ACETATE 25 MG RE SUPP: 25.0000 mg | Freq: Two times a day (BID) | RECTAL | Status: AC

## 2011-10-16 MED ORDER — MEPERIDINE HCL 100 MG/ML IJ SOLN
INTRAMUSCULAR | Status: AC
  Filled 2011-10-16: qty 2

## 2011-10-16 MED ORDER — MEPERIDINE HCL 100 MG/ML IJ SOLN
INTRAMUSCULAR | Status: DC | PRN
  Administered 2011-10-16: 25 mg via INTRAVENOUS

## 2011-10-16 MED ORDER — MIDAZOLAM HCL 5 MG/5ML IJ SOLN
INTRAMUSCULAR | Status: DC | PRN
  Administered 2011-10-16 (×2): 1 mg via INTRAVENOUS

## 2011-10-16 MED ORDER — STERILE WATER FOR IRRIGATION IR SOLN
Status: DC | PRN
  Administered 2011-10-16: 11:00:00

## 2011-10-16 MED ORDER — MIDAZOLAM HCL 5 MG/5ML IJ SOLN
INTRAMUSCULAR | Status: AC
  Filled 2011-10-16: qty 10

## 2011-10-16 NOTE — Interval H&P Note (Signed)
History and Physical Interval Note:  10/16/2011 10:34 AM  Barbara Snyder  has presented today for surgery, with the diagnosis of rectal bleeding  The various methods of treatment have been discussed with the patient and family. After consideration of risks, benefits and other options for treatment, the patient has consented to  Procedure(s) (LRB): COLONOSCOPY (N/A) as a surgical intervention .  The patients' history has been reviewed, patient examined, no change in status, stable for surgery.  I have reviewed the patients' chart and labs.  Questions were answered to the patient's satisfaction.     Eula Listen

## 2011-10-16 NOTE — Op Note (Signed)
[facility] [address] [place], [place]  [address]  COLONOSCOPY PROCEDURE REPORT  PATIENT:  Barbara Snyder, Barbara Snyder  MR#:  [id number] BIRTHDATE:  1920/02/01, 89 yrs. old  GENDER:  female ENDOSCOPIST:  R. Roetta Sessions, MD FACP [facility] REF. BY:          Dr. Ouida Sills PROCEDURE DATE:  10/16/2011 PROCEDURE:  colonoscopy snare polypectomy  INDICATIONS:  hematochezia; positive family history of colon cancer.  INFORMED CONSENT:  The risks, benefits, alternatives and imponderables including but not limited to bleeding, perforation as well as the possibility of a missed lesion have been reviewed. The potential for biopsy, lesion removal, etc. have also been discussed.  Questions have been answered.  All parties agreeable. Please see the history and physical in the medical record for more information.  MEDICATIONS:  Versed 2 mg IV and Demerol 25 mg IV in divided doses.  DESCRIPTION OF PROCEDURE:  After a digital rectal exam was performed, the EC-3890Li ([id number]) and EC-3490Li ([id number]) colonoscope was advanced from the anus through the rectum and colon to the area of the cecum, ileocecal valve and appendiceal orifice.  The cecum was deeply intubated.  These structures were well-seen and photographed for the record.  From the level of the cecum and ileocecal valve, the scope was slowly and cautiously withdrawn.  The mucosal surfaces were carefully surveyed utilizing scope tip deflection to facilitate fold flattening as needed.  The scope was pulled down into the rectum where a thorough examination including retroflexion was performed. <<PROCEDUREIMAGES>>  FINDINGS:  adequate preparation.  internal hemorrhoids; otherwise normal rectum. Lipomatous fold in the mid sigmoid segment. Sigmoid diverticula. 6 mm polyp in the descending segment; otherwise, the remainder of the colonic mucosa appeared normal.  THERAPEUTIC / DIAGNOSTIC MANEUVERS PERFORMED:  the the descending colon polyp was  hot snare removed.  COMPLICATIONS:  none  CECAL WITHDRAWAL TIME:   10 minutes  IMPRESSION:    Internal hemorrhoids and sigmoid diverticulosis. The descending colon polyp was removal as described above.  RECOMMENDATIONS:    Course of Anusol suppositories. One half the usual dose of Metamucil daily. Follow up on pathology.  Discussed at length with  son.  ______________________________ R. Roetta Sessions, MD Caleen Essex  CC:  n. eSIGNED:   R. Roetta Sessions at 10/16/2011 11:14 AM  Ernest Haber, [phone]

## 2011-10-16 NOTE — H&P (View-Only) (Signed)
Referring Provider: Fagan, Roy, MD Primary Care Physician:  FAGAN,ROY, MD, MD Primary Gastroenterologist: Dr. Rourk   Chief Complaint  Patient presents with  . Colonoscopy    HPI:   Barbara Snyder is a 78-year-old lady who returns today for reconsideration of colonoscopy. She is accompanied by her son today. I originally saw her in October due to low-volume hematochezia, and she was treated with a short course of anusol suppositories. Last TCS had been in 2004 with plans to repeat in 2009. Sister diagnosed with colon cancer in past as well as nephew. In November, son continued to report "smears" in underwear. He was hesitant to proceed with a colonoscopy at that time, and he continued to say "we will proceed if this is recommended by Dr. Rourk". I reviewed this with RMR, and it was decided that due to clinical signs of rectal bleeding and positive FH of colon cancer, a colonoscopy should be offered. I informed the son of this, and it was scheduled. However, they cancelled shortly thereafter, stating he did not want to proceed.  They have returned today, and the son continues to ask me what our recommendations are. It is quite clear in previous notes and phone notes that we have recommended a colonoscopy. He at times seems somewhat brisk with his mother, and I had a long discussion with them regarding the risks and benefits. He states he is worried about the cost of this, and states he hates to "bring money into it". Again, I told them this was up to the patient, Barbara Snyder, if she wanted to proceed. She stated she would, and her son states he would feel more comfortable if we proceeded as well. He is the power of attorney.  She denies any abdominal pain, change in bowel habits, and she has not noted any recent hematochezia.   Past Medical History  Diagnosis Date  . Alzheimer disease   . Hypertension   . CHF (congestive heart failure)   . Arthritis   . Thyroid disease   . Myocardial infarct  2001  . S/P colonoscopy Apr 2004    left-sided diverticula, internal hemorrhoids (RMR)  . Hip pain   . Skin rash   . Cholesterol blood decreased   . Trouble in sleeping   . Asthma     Past Surgical History  Procedure Date  . Mastoidectomy   . Colonoscopy 12/20/02    internal hemorrhoids otherwise normal/left-side diverticula    Current Outpatient Prescriptions  Medication Sig Dispense Refill  . furosemide (LASIX) 40 MG tablet Take 20 mg by mouth daily.       . metoprolol tartrate (LOPRESSOR) 25 MG tablet Take 12.5 mg by mouth 2 (two) times daily.        . mirtazapine (REMERON) 15 MG tablet Take 7.5 mg by mouth at bedtime.        . Multiple Vitamins-Minerals (MULTIVITAMIN WITH MINERALS) tablet Take 1 tablet by mouth daily.        . spironolactone (ALDACTONE) 25 MG tablet Take 25 mg by mouth daily.        . Sod Picosulfate-Mag Ox-Cit Acd (PREPOPIK) 10-3.5-12 MG-GM-GM PACK Take 1 kit by mouth once.  1 each  0    Allergies as of 09/18/2011  . (No Known Allergies)    Family History  Problem Relation Age of Onset  . Colon cancer Sister     Stage II, in remission  . Colon cancer      nephew; Stage IV      History   Social History  . Marital Status: Widowed    Spouse Name: N/A    Number of Children: N/A  . Years of Education: N/A   Social History Main Topics  . Smoking status: Never Smoker   . Smokeless tobacco: None  . Alcohol Use: No  . Drug Use: No  . Sexually Active:    Other Topics Concern  . None   Social History Narrative  . None    Review of Systems: Gen: Denies fever, chills, anorexia. Denies fatigue, weakness, weight loss.  CV: Denies chest pain, palpitations, syncope, peripheral edema, and claudication. Resp: Denies dyspnea at rest, cough, wheezing, coughing up blood, and pleurisy. GI: Denies vomiting blood, jaundice, and fecal incontinence.   Denies dysphagia or odynophagia. Derm: Denies rash, itching, dry skin Psych: Denies depression, anxiety    Heme: Denies bruising, bleeding, and enlarged lymph nodes.  Physical Exam: BP 121/71  Pulse 77  Temp(Src) 97 F (36.1 C) (Temporal)  Ht 5' (1.524 m)  Wt 92 lb 6.4 oz (41.912 kg)  BMI 18.05 kg/m2 General:   Alert and oriented. No distress noted. Pleasant and cooperative. Petite.  Head:  Normocephalic and atraumatic. Eyes:  Conjuctiva clear without scleral icterus. Mouth:  Oral mucosa pink and moist. Good dentition. No lesions. Neck:  Supple, without mass or thyromegaly. Heart:  S1, S2 present without murmurs, rubs, or gallops. Regular rate and rhythm. Abdomen:  +BS, soft, non-tender and non-distended. No rebound or guarding. No HSM or masses noted. Msk:  Symmetrical without gross deformities. Normal posture. Extremities:  Without edema. Neurologic:  Alert and  oriented x4;  grossly normal neurologically. Skin:  Intact without significant lesions or rashes. Cervical Nodes:  No significant cervical adenopathy. Psych:  Alert and cooperative. Normal mood and affect.  

## 2011-10-16 NOTE — Discharge Instructions (Addendum)
Colonoscopy Discharge Instructions  Read the instructions outlined below and refer to this sheet in the next few weeks. These discharge instructions provide you with general information on caring for yourself after you leave the hospital. Your doctor may also give you specific instructions. While your treatment has been planned according to the most current medical practices available, unavoidable complications occasionally occur. If you have any problems or questions after discharge, call Dr. Jena Gauss at [phone]. ACTIVITY  You may resume your regular activity, but move at a slower pace for the next 24 hours.   Take frequent rest periods for the next 24 hours.   Walking will help get rid of the air and reduce the bloated feeling in your belly (abdomen).   No driving for 24 hours (because of the medicine (anesthesia) used during the test).    Do not sign any important legal documents or operate any machinery for 24 hours (because of the anesthesia used during the test).  NUTRITION  Drink plenty of fluids.   You may resume your normal diet as instructed by your doctor.   Begin with a light meal and progress to your normal diet. Heavy or fried foods are harder to digest and may make you feel sick to your stomach (nauseated).   Avoid alcoholic beverages for 24 hours or as instructed.  MEDICATIONS  You may resume your normal medications unless your doctor tells you otherwise.  WHAT YOU CAN EXPECT TODAY  Some feelings of bloating in the abdomen.   Passage of more gas than usual.   Spotting of blood in your stool or on the toilet paper.  IF YOU HAD POLYPS REMOVED DURING THE COLONOSCOPY:  No aspirin products for 7 days or as instructed.   No alcohol for 7 days or as instructed.   Eat a soft diet for the next 24 hours.  FINDING OUT THE RESULTS OF YOUR TEST Not all test results are available during your visit. If your test results are not back during the visit, make an appointment  with your caregiver to find out the results. Do not assume everything is normal if you have not heard from your caregiver or the medical facility. It is important for you to follow up on all of your test results.  SEEK IMMEDIATE MEDICAL ATTENTION IF:  You have more than a spotting of blood in your stool.   Your belly is swollen (abdominal distention).   You are nauseated or vomiting.   You have a temperature over 101.   You have abdominal pain or discomfort that is severe or gets worse throughout the day.    Hemorrhoid, polyp and diverticulosis information provided.  Take one half a scoop or half a packet of Metamucil daily.  Course of Anusol suppositories as directed.  Further recommendations to follow pending review of pathology report.  Diverticulosis Diverticulosis is a common condition that develops when small pouches (diverticula) form in the wall of the colon. The risk of diverticulosis increases with age. It happens more often in people who eat a low-fiber diet. Most individuals with diverticulosis have no symptoms. Those individuals with symptoms usually experience abdominal pain, constipation, or loose stools (diarrhea). HOME CARE INSTRUCTIONS   Increase the amount of fiber in your diet as directed by your caregiver or dietician. This may reduce symptoms of diverticulosis.   Your caregiver may recommend taking a dietary fiber supplement.   Drink at least 6 to 8 glasses of water each day to prevent constipation.   Try  not to strain when you have a bowel movement.   Your caregiver may recommend avoiding nuts and seeds to prevent complications, although this is still an uncertain benefit.   Only take over-the-counter or prescription medicines for pain, discomfort, or fever as directed by your caregiver.  FOODS WITH HIGH FIBER CONTENT INCLUDE:  Fruits. Apple, peach, pear, tangerine, raisins, prunes.   Vegetables. Brussels sprouts, asparagus, broccoli, cabbage, carrot,  cauliflower, romaine lettuce, spinach, summer squash, tomato, winter squash, zucchini.   Starchy Vegetables. Baked beans, kidney beans, lima beans, split peas, lentils, potatoes (with skin).   Grains. Whole wheat bread, brown rice, bran flake cereal, plain oatmeal, white rice, shredded wheat, bran muffins.  SEEK IMMEDIATE MEDICAL CARE IF:   You develop increasing pain or severe bloating.   You have an oral temperature above 102 F (38.9 C), not controlled by medicine.   You develop vomiting or bowel movements that are bloody or black.  Document Released: 05/08/2004 Document Revised: 04/23/2011 Document Reviewed: 01/09/2010 [facility] Patient Information 2012 [place], [place].Colon Polyps A polyp is extra tissue that grows inside your body. Colon polyps grow in the large intestine. The large intestine, also called the colon, is part of your digestive system. It is a long, hollow tube at the end of your digestive tract where your body makes and stores stool. Most polyps are not dangerous. They are benign. This means they are not cancerous. But over time, some types of polyps can turn into cancer. Polyps that are smaller than a pea are usually not harmful. But larger polyps could someday become or may already be cancerous. To be safe, doctors remove all polyps and test them.  WHO GETS POLYPS? Anyone can get polyps, but certain people are more likely than others. You may have a greater chance of getting polyps if:  You are over 50.   You have had polyps before.   Someone in your family has had polyps.   Someone in your family has had cancer of the large intestine.   Find out if someone in your family has had polyps. You may also be more likely to get polyps if you:   Eat a lot of fatty foods.   Smoke.   Drink alcohol.   Do not exercise.   Eat too much.  SYMPTOMS  Most small polyps do not cause symptoms. People often do not know they have one until their caregiver finds it during a  regular checkup or while testing them for something else. Some people do have symptoms like these:  Bleeding from the anus. You might notice blood on your underwear or on toilet paper after you have had a bowel movement.   Constipation or diarrhea that lasts more than a week.   Blood in the stool. Blood can make stool look black or it can show up as red streaks in the stool.  If you have any of these symptoms, see your caregiver. HOW DOES THE DOCTOR TEST FOR POLYPS? The doctor can use four tests to check for polyps:  Digital rectal exam. The caregiver wears gloves and checks your rectum (the last part of the large intestine) to see if it feels normal. This test would find polyps only in the rectum. Your caregiver may need to do one of the other tests listed below to find polyps higher up in the intestine.   Barium enema. The caregiver puts a liquid called barium into your rectum before taking x-rays of your large intestine. Barium makes your intestine  look white in the pictures. Polyps are dark, so they are easy to see.   Sigmoidoscopy. With this test, the caregiver can see inside your large intestine. A thin flexible tube is placed into your rectum. The device is called a sigmoidoscope, which has a light and a tiny video camera in it. The caregiver uses the sigmoidoscope to look at the last third of your large intestine.   Colonoscopy. This test is like sigmoidoscopy, but the caregiver looks at all of the large intestine. It usually requires sedation. This is the most common method for finding and removing polyps.  TREATMENT   The caregiver will remove the polyp during sigmoidoscopy or colonoscopy. The polyp is then tested for cancer.   If you have had polyps, your caregiver may want you to get tested regularly in the future.  PREVENTION  There is not one sure way to prevent polyps. You might be able to lower your risk of getting them if you:  Eat more fruits and vegetables and less fatty  food.   Do not smoke.   Avoid alcohol.   Exercise every day.   Lose weight if you are overweight.   Eating more calcium and folate can also lower your risk of getting polyps. Some foods that are rich in calcium are milk, cheese, and broccoli. Some foods that are rich in folate are chickpeas, kidney beans, and spinach.   Aspirin might help prevent polyps. Studies are under way.  Document Released: 05/07/2004 Document Revised: 04/23/2011 Document Reviewed: 10/13/2007 [facility] Patient Information 2012 [place], [place].Hemorrhoids Hemorrhoids are enlarged (dilated) veins around the rectum. There are 2 types of hemorrhoids, and the type of hemorrhoid is determined by its location. Internal hemorrhoids occur in the veins just inside the rectum.They are usually not painful, but they may bleed.However, they may poke through to the outside and become irritated and painful. External hemorrhoids involve the veins outside the anus and can be felt as a painful swelling or hard lump near the anus.They are often itchy and may crack and bleed. Sometimes clots will form in the veins. This makes them swollen and painful. These are called thrombosed hemorrhoids. CAUSES Causes of hemorrhoids include:  Pregnancy. This increases the pressure in the hemorrhoidal veins.   Constipation.   Straining to have a bowel movement.   Obesity.   Heavy lifting or other activity that caused you to strain.  TREATMENT Most of the time hemorrhoids improve in 1 to 2 weeks. However, if symptoms do not seem to be getting better or if you have a lot of rectal bleeding, your caregiver may perform a procedure to help make the hemorrhoids get smaller or remove them completely.Possible treatments include:  Rubber band ligation. A rubber band is placed at the base of the hemorrhoid to cut off the circulation.   Sclerotherapy. A chemical is injected to shrink the hemorrhoid.   Infrared light therapy. Tools are used to burn  the hemorrhoid.   Hemorrhoidectomy. This is surgical removal of the hemorrhoid.  HOME CARE INSTRUCTIONS   Increase fiber in your diet. Ask your caregiver about using fiber supplements.   Drink enough water and fluids to keep your urine clear or pale yellow.   Exercise regularly.   Go to the bathroom when you have the urge to have a bowel movement. Do not wait.   Avoid straining to have bowel movements.   Keep the anal area dry and clean.   Only take over-the-counter or prescription medicines for pain, discomfort, or  fever as directed by your caregiver.  If your hemorrhoids are thrombosed:  Take warm sitz baths for 20 to 30 minutes, 3 to 4 times per day.   If the hemorrhoids are very tender and swollen, place ice packs on the area as tolerated. Using ice packs between sitz baths may be helpful. Fill a plastic bag with ice. Place a towel between the bag of ice and your skin.   Medicated creams and suppositories may be used or applied as directed.   Do not use a donut-shaped pillow or sit on the toilet for long periods. This increases blood pooling and pain.  SEEK MEDICAL CARE IF:   You have increasing pain and swelling that is not controlled with your medicine.   You have uncontrolled bleeding.   You have difficulty or you are unable to have a bowel movement.   You have pain or inflammation outside the area of the hemorrhoids.   You have chills or an oral temperature above 102 F (38.9 C).  MAKE SURE YOU:   Understand these instructions.   Will watch your condition.   Will get help right away if you are not doing well or get worse.  Document Released: 08/08/2000 Document Revised: 04/23/2011 Document Reviewed: 12/14/2007 [facility] Patient Information 2012 [place], [place].

## 2011-10-18 ENCOUNTER — Encounter: Payer: Self-pay | Admitting: Internal Medicine

## 2011-10-22 ENCOUNTER — Encounter (HOSPITAL_COMMUNITY): Payer: Self-pay | Admitting: Internal Medicine

## 2011-12-13 ENCOUNTER — Emergency Department (HOSPITAL_COMMUNITY)
Admission: EM | Admit: 2011-12-13 | Discharge: 2011-12-13 | Disposition: A | Discharge status: Home / Self Care | Payer: Medicare Other | Attending: Emergency Medicine | Admitting: Emergency Medicine

## 2011-12-13 ENCOUNTER — Encounter (HOSPITAL_COMMUNITY): Payer: Self-pay | Admitting: *Deleted

## 2011-12-13 ENCOUNTER — Emergency Department (HOSPITAL_COMMUNITY): Payer: Medicare Other

## 2011-12-13 DIAGNOSIS — I10 Essential (primary) hypertension: Secondary | ICD-10-CM | POA: Insufficient documentation

## 2011-12-13 DIAGNOSIS — G319 Degenerative disease of nervous system, unspecified: Secondary | ICD-10-CM | POA: Insufficient documentation

## 2011-12-13 DIAGNOSIS — Z8739 Personal history of other diseases of the musculoskeletal system and connective tissue: Secondary | ICD-10-CM | POA: Insufficient documentation

## 2011-12-13 DIAGNOSIS — I509 Heart failure, unspecified: Secondary | ICD-10-CM | POA: Insufficient documentation

## 2011-12-13 DIAGNOSIS — S0990XA Unspecified injury of head, initial encounter: Secondary | ICD-10-CM | POA: Insufficient documentation

## 2011-12-13 DIAGNOSIS — G309 Alzheimer's disease, unspecified: Secondary | ICD-10-CM | POA: Insufficient documentation

## 2011-12-13 DIAGNOSIS — W19XXXA Unspecified fall, initial encounter: Secondary | ICD-10-CM | POA: Insufficient documentation

## 2011-12-13 DIAGNOSIS — F0280 Dementia in other diseases classified elsewhere without behavioral disturbance: Secondary | ICD-10-CM | POA: Insufficient documentation

## 2011-12-13 DIAGNOSIS — Z79899 Other long term (current) drug therapy: Secondary | ICD-10-CM | POA: Insufficient documentation

## 2011-12-13 DIAGNOSIS — I252 Old myocardial infarction: Secondary | ICD-10-CM | POA: Insufficient documentation

## 2011-12-13 DIAGNOSIS — Y92009 Unspecified place in unspecified non-institutional (private) residence as the place of occurrence of the external cause: Secondary | ICD-10-CM | POA: Insufficient documentation

## 2011-12-13 MED ORDER — ACETAMINOPHEN 325 MG PO TABS
650.0000 mg | ORAL_TABLET | Freq: Once | ORAL | Status: AC
  Administered 2011-12-13: 650 mg via ORAL
  Filled 2011-12-13: qty 2

## 2011-12-13 NOTE — ED Notes (Signed)
Ice pack placed to right side of head.

## 2011-12-13 NOTE — ED Notes (Signed)
Pt fell an hour ago hitting the rt occipital area. Pt has visible hematoma in this region. Denies LOC.

## 2011-12-13 NOTE — ED Provider Notes (Signed)
History     CSN: [id number]  Arrival date & time 12/13/11  1234   First MD Initiated Contact with Patient 12/13/11 1310      Chief Complaint  Patient presents with  . Head Injury    (Consider location/radiation/quality/duration/timing/severity/associated sxs/prior treatment) HPI....Marland Kitchenlevel V caveat for dementia..  Accidental fall home today. C/o pain occipital area. No loss of consciousness or new neurological deficits. Complains of hematoma to area. No radiation of pain. No neck pain or hip pain.  Pain is minimal  Past Medical History  Diagnosis Date  . Alzheimer disease   . Hypertension   . CHF (congestive heart failure)   . Arthritis   . Thyroid disease   . Myocardial infarct 2001  . S/P colonoscopy Apr 2004    left-sided diverticula, internal hemorrhoids (RMR)  . Hip pain   . Skin rash   . Cholesterol blood decreased   . Trouble in sleeping   . Anxiety   . Coronary artery disease     Past Surgical History  Procedure Date  . Mastoidectomy   . Colonoscopy 12/20/02    internal hemorrhoids otherwise normal/left-side diverticula  . Colonoscopy 10/16/2011    Procedure: COLONOSCOPY;  Surgeon: Corbin Ade, MD;  Location: [facility];  Service: Endoscopy;  Laterality: N/A;  10:00- pts family request this time     Family History  Problem Relation Age of Onset  . Colon cancer Sister     Stage II, in remission  . Colon cancer      nephew; Stage IV    History  Substance Use Topics  . Smoking status: Never Smoker   . Smokeless tobacco: Not on file  . Alcohol Use: No    OB History    Grav Para Term Preterm Abortions TAB SAB Ect Mult Living                  Review of Systems  Unable to perform ROS   Allergies  Review of patient's allergies indicates no known allergies.  Home Medications   Current Outpatient Rx  Name Route Sig Dispense Refill  . ACETAMINOPHEN 325 MG PO TABS Oral Take 325 mg by mouth every 6 (six) hours as needed. For pain    .  FUROSEMIDE 40 MG PO TABS Oral Take 20 mg by mouth daily.     Marland Kitchen METOPROLOL TARTRATE 25 MG PO TABS Oral Take 12.5 mg by mouth 2 (two) times daily.      Marland Kitchen MIRTAZAPINE 15 MG PO TABS Oral Take 7.5 mg by mouth at bedtime as needed.     . MULTI-VITAMIN/MINERALS PO TABS Oral Take 1 tablet by mouth daily.      Marland Kitchen SPIRONOLACTONE 25 MG PO TABS Oral Take 25 mg by mouth daily.        BP 135/64  Pulse 70  Temp(Src) 97.9 F (36.6 C) (Oral)  Resp 20  Ht 5' (1.524 m)  Wt 92 lb (41.731 kg)  BMI 17.97 kg/m2  SpO2 95%  Physical Exam  Nursing note and vitals reviewed. Constitutional: She appears well-developed and well-nourished.  HENT:  Head: Normocephalic.       2.5 CM hematoma left occipital area  Eyes: Conjunctivae and EOM are normal. Pupils are equal, round, and reactive to light.  Neck: Normal range of motion. Neck supple.  Cardiovascular: Normal rate and regular rhythm.   Pulmonary/Chest: Effort normal and breath sounds normal.  Abdominal: Soft. Bowel sounds are normal.  Musculoskeletal: Normal range of motion.  Neurological: She is alert.       demented  Skin: Skin is warm and dry.  Psychiatric:       Patient is demented    ED Course  Procedures (including critical care time)  Labs Reviewed - No data to display Ct Head Wo Contrast  12/13/2011  *RADIOLOGY REPORT*  Clinical Data: Fall with a blow to the back of the head.  CT HEAD WITHOUT CONTRAST  Technique:  Contiguous axial images were obtained from the base of the skull through the vertex without contrast.  Comparison: Head CT scan 04/04/2011.  Findings: Scalp contusion on the left posteriorly near the vertex is identified.  No underlying acute intracranial abnormality including infarction, hemorrhage, mass lesion, mass effect, midline shift or abnormal extra-axial fluid collection.  Atrophy and chronic microvascular ischemic change are noted.  Calvarium is intact.  IMPRESSION:  1.  Scalp contusion without underlying acute intracranial  abnormality or fracture. 2.  Atrophy and chronic microvascular ischemic change.  Original Report Authenticated By: Bernadene Bell. D'ALESSIO, M.D.     1. Minor head injury       MDM  CT head negative.  Test results discussed with patient and son.        Donnetta Hutching, MD 12/13/11 1547

## 2011-12-13 NOTE — ED Notes (Signed)
Patient to [facility] at patient request

## 2011-12-13 NOTE — ED Notes (Signed)
Pt requesting something for pain, Dr. Cook notified, additional orders given  

## 2011-12-13 NOTE — Discharge Instructions (Signed)
CT scan was normal. Ice pack to head. Tylenol for pain

## 2013-06-10 ENCOUNTER — Encounter (HOSPITAL_COMMUNITY): Payer: Self-pay | Admitting: Emergency Medicine

## 2013-06-10 ENCOUNTER — Emergency Department (HOSPITAL_COMMUNITY)
Admission: EM | Admit: 2013-06-10 | Discharge: 2013-06-10 | Disposition: A | Discharge status: Home / Self Care | Payer: Medicare Other | Attending: Emergency Medicine | Admitting: Emergency Medicine

## 2013-06-10 ENCOUNTER — Emergency Department (HOSPITAL_COMMUNITY): Payer: Medicare Other

## 2013-06-10 DIAGNOSIS — G309 Alzheimer's disease, unspecified: Secondary | ICD-10-CM | POA: Insufficient documentation

## 2013-06-10 DIAGNOSIS — F411 Generalized anxiety disorder: Secondary | ICD-10-CM | POA: Insufficient documentation

## 2013-06-10 DIAGNOSIS — Z872 Personal history of diseases of the skin and subcutaneous tissue: Secondary | ICD-10-CM | POA: Insufficient documentation

## 2013-06-10 DIAGNOSIS — I2510 Atherosclerotic heart disease of native coronary artery without angina pectoris: Secondary | ICD-10-CM | POA: Insufficient documentation

## 2013-06-10 DIAGNOSIS — Z9889 Other specified postprocedural states: Secondary | ICD-10-CM | POA: Insufficient documentation

## 2013-06-10 DIAGNOSIS — Z8639 Personal history of other endocrine, nutritional and metabolic disease: Secondary | ICD-10-CM | POA: Insufficient documentation

## 2013-06-10 DIAGNOSIS — M129 Arthropathy, unspecified: Secondary | ICD-10-CM | POA: Insufficient documentation

## 2013-06-10 DIAGNOSIS — F0280 Dementia in other diseases classified elsewhere without behavioral disturbance: Secondary | ICD-10-CM | POA: Insufficient documentation

## 2013-06-10 DIAGNOSIS — S59909A Unspecified injury of unspecified elbow, initial encounter: Secondary | ICD-10-CM | POA: Insufficient documentation

## 2013-06-10 DIAGNOSIS — I252 Old myocardial infarction: Secondary | ICD-10-CM | POA: Insufficient documentation

## 2013-06-10 DIAGNOSIS — I509 Heart failure, unspecified: Secondary | ICD-10-CM | POA: Insufficient documentation

## 2013-06-10 DIAGNOSIS — R296 Repeated falls: Secondary | ICD-10-CM | POA: Insufficient documentation

## 2013-06-10 DIAGNOSIS — Z862 Personal history of diseases of the blood and blood-forming organs and certain disorders involving the immune mechanism: Secondary | ICD-10-CM | POA: Insufficient documentation

## 2013-06-10 DIAGNOSIS — Z79899 Other long term (current) drug therapy: Secondary | ICD-10-CM | POA: Insufficient documentation

## 2013-06-10 DIAGNOSIS — Y92009 Unspecified place in unspecified non-institutional (private) residence as the place of occurrence of the external cause: Secondary | ICD-10-CM | POA: Insufficient documentation

## 2013-06-10 DIAGNOSIS — S6990XA Unspecified injury of unspecified wrist, hand and finger(s), initial encounter: Secondary | ICD-10-CM | POA: Insufficient documentation

## 2013-06-10 DIAGNOSIS — Y9389 Activity, other specified: Secondary | ICD-10-CM | POA: Insufficient documentation

## 2013-06-10 DIAGNOSIS — I10 Essential (primary) hypertension: Secondary | ICD-10-CM | POA: Insufficient documentation

## 2013-06-10 DIAGNOSIS — W19XXXA Unspecified fall, initial encounter: Secondary | ICD-10-CM

## 2013-06-10 NOTE — ED Provider Notes (Signed)
CSN: [id number]     Arrival date & time 06/10/13  1750 History   First MD Initiated Contact with Patient 06/10/13 1815     This chart was scribed for Gerhard Munch, MD by Manuela Schwartz, ED scribe. This patient was seen in room [facility]11/[facility]11 and the patient's care was started at 1750.  Chief Complaint  Patient presents with  . Fall   The history is provided by the patient. No language interpreter was used.   HPI Comments: Barbara Snyder is a 79 y.o. female who presents to the Emergency Department complaining of a skin tear to her right forearm after she fell backwards onto a concrete step while getting into her son's truck this PM, no LOC. Her son states that she has alzheimer's and normally uses a walker for ambulatory assistance. She denies any other injuries. Her son states she did not hit her head during the fall.  She specifically denies chest pain, abdominal pain, lightheadedness, nausea, vomiting, recent illness.   Past Medical History  Diagnosis Date  . Alzheimer disease   . Hypertension   . CHF (congestive heart failure)   . Arthritis   . Thyroid disease   . Myocardial infarct 2001  . S/P colonoscopy Apr 2004    left-sided diverticula, internal hemorrhoids (RMR)  . Hip pain   . Skin rash   . Cholesterol blood decreased   . Trouble in sleeping   . Anxiety   . Coronary artery disease    Past Surgical History  Procedure Laterality Date  . Mastoidectomy    . Colonoscopy  12/20/02    internal hemorrhoids otherwise normal/left-side diverticula  . Colonoscopy  10/16/2011    Procedure: COLONOSCOPY;  Surgeon: Corbin Ade, MD;  Location: [facility];  Service: Endoscopy;  Laterality: N/A;  10:00- pts family request this time    Family History  Problem Relation Age of Onset  . Colon cancer Sister     Stage II, in remission  . Colon cancer      nephew; Stage IV   History  Substance Use Topics  . Smoking status: Never Smoker   . Smokeless tobacco: Not on file  .  Alcohol Use: No   OB History   Grav Para Term Preterm Abortions TAB SAB Ect Mult Living                 Review of Systems  Constitutional: Negative for fever and chills.  Respiratory: Negative for shortness of breath.   Cardiovascular: Negative for chest pain.  Gastrointestinal: Negative for nausea, vomiting and abdominal pain.  Musculoskeletal: Negative for back pain.  Skin: Positive for wound (right forearm skin tear).  Neurological: Negative for weakness.  All other systems reviewed and are negative.   A complete 10 system review of systems was obtained and all systems are negative except as noted in the HPI and PMH.   Allergies  Review of patient's allergies indicates no known allergies.  Home Medications   Current Outpatient Rx  Name  Route  Sig  Dispense  Refill  . furosemide (LASIX) 40 MG tablet   Oral   Take 20 mg by mouth daily.          . metoprolol tartrate (LOPRESSOR) 25 MG tablet   Oral   Take 12.5 mg by mouth 2 (two) times daily.           . Multiple Vitamins-Minerals (MULTIVITAMIN WITH MINERALS) tablet   Oral   Take 1 tablet by mouth  daily.           . sertraline (ZOLOFT) 50 MG tablet   Oral   Take 50 mg by mouth daily.         Marland Kitchen spironolactone (ALDACTONE) 25 MG tablet   Oral   Take 25 mg by mouth daily.          Marland Kitchen acetaminophen (TYLENOL) 325 MG tablet   Oral   Take 325 mg by mouth every 6 (six) hours as needed. For pain          Triage Vitals: BP 120/73  Pulse 90  Temp(Src) 98.2 F (36.8 C) (Oral)  Resp 20  Wt 98 lb (44.453 kg)  BMI 19.14 kg/m2  SpO2 95% Physical Exam  Nursing note and vitals reviewed. Constitutional: She is oriented to person, place, and time. She appears well-developed and well-nourished. No distress.  HENT:  Head: Normocephalic and atraumatic.  Eyes: EOM are normal.  Neck: Neck supple. No tracheal deviation present.  Cardiovascular: Normal rate, regular rhythm and normal heart sounds.   No murmur  heard. Pulmonary/Chest: Effort normal and breath sounds normal. No respiratory distress. She has no wheezes. She has no rales.  Abdominal: Soft. Bowel sounds are normal. She exhibits no distension. There is no tenderness. There is no rebound.  Musculoskeletal: Normal range of motion.  Shoulders symmetric and clavicles are fine. Crepitus over right ulnar styloid process. ROM right elbow normal. No right shoulder tenderness. Normal ROM to both hips. Hips non-tender. Pelvis stable.  Neurological: She is alert and oriented to person, place, and time.  Good grip strength right and left upper extremities.   Skin: Skin is warm and dry.  Skin tear 4 cm to her right forearm.   Psychiatric: She has a normal mood and affect. Her behavior is normal.    ED Course  Procedures (including critical care time) DIAGNOSTIC STUDIES: Oxygen Saturation is 95% on room air, normal by my interpretation.    COORDINATION OF CARE: At 630 PM Discussed treatment plan with patient which includes pelvis and right forearm X-ray, wound care. Patient agrees.   Labs Review Labs Reviewed - No data to display Imaging Review No results found.  EKG Interpretation   None     7:49 PM I discussed all results with the patient's son, who is her power of attorney.  Specifically, we discussed the incidental finding of likely abdominal aortic aneurysm.  With the patient's age, comorbidities, dementia, he elected to not pursue additional imaging, or consideration of surgical evaluation.  This seems reasonable, given the aforementioned concerns.  He will followup with her primary care physician, for additional guidance, but the patient is already on antihypertensives.  Update: On repeat exam the patient appears comfortable, has no current complaints.  MDM  No diagnosis found.  I personally performed the services described in this documentation, which was scribed in my presence. The recorded information has been reviewed and is  accurate.   This elderly female presents after a mechanical fall.  Notably, the patient has multiple medical problems, including dementia, osteoporosis, CAD, hypertension.  Patient's evaluation was largely reassuring in terms of the traumatic defect of the fall.  However, the patient's x-rays did demonstrate a likely abdominal aortic aneurysm.  This was discussed at length with the patient's son, her power of attorney.  We discussed the range of options, including surgical consult, additional imaging, watchful waiting, with a likely progression of disease.  The patient's son opted for watchful waiting with primary care followup.  This seems reasonable, given the patient's condition.     Gerhard Munch, MD 06/10/13 [phone]

## 2013-06-10 NOTE — ED Notes (Addendum)
Fell when getting into a truck,alert, talking, Injury to rt arm with skin tear. Son thinks she may have hit her head,.

## 2013-06-10 NOTE — ED Notes (Signed)
MD at bedside. 

## 2014-12-31 ENCOUNTER — Encounter (HOSPITAL_COMMUNITY): Payer: Self-pay | Admitting: Emergency Medicine

## 2014-12-31 ENCOUNTER — Emergency Department (HOSPITAL_COMMUNITY)
Admission: EM | Admit: 2014-12-31 | Discharge: 2014-12-31 | Disposition: A | Discharge status: Home / Self Care | Payer: Medicare Other | Attending: Emergency Medicine | Admitting: Emergency Medicine

## 2014-12-31 ENCOUNTER — Emergency Department (HOSPITAL_COMMUNITY): Payer: Medicare Other

## 2014-12-31 DIAGNOSIS — S0003XA Contusion of scalp, initial encounter: Secondary | ICD-10-CM | POA: Diagnosis not present

## 2014-12-31 DIAGNOSIS — S0990XA Unspecified injury of head, initial encounter: Secondary | ICD-10-CM | POA: Diagnosis present

## 2014-12-31 DIAGNOSIS — I10 Essential (primary) hypertension: Secondary | ICD-10-CM | POA: Insufficient documentation

## 2014-12-31 DIAGNOSIS — Z79899 Other long term (current) drug therapy: Secondary | ICD-10-CM | POA: Diagnosis not present

## 2014-12-31 DIAGNOSIS — F419 Anxiety disorder, unspecified: Secondary | ICD-10-CM | POA: Insufficient documentation

## 2014-12-31 DIAGNOSIS — I509 Heart failure, unspecified: Secondary | ICD-10-CM | POA: Diagnosis not present

## 2014-12-31 DIAGNOSIS — Y998 Other external cause status: Secondary | ICD-10-CM | POA: Diagnosis not present

## 2014-12-31 DIAGNOSIS — Y9289 Other specified places as the place of occurrence of the external cause: Secondary | ICD-10-CM | POA: Diagnosis not present

## 2014-12-31 DIAGNOSIS — G309 Alzheimer's disease, unspecified: Secondary | ICD-10-CM | POA: Diagnosis not present

## 2014-12-31 DIAGNOSIS — Z8639 Personal history of other endocrine, nutritional and metabolic disease: Secondary | ICD-10-CM | POA: Diagnosis not present

## 2014-12-31 DIAGNOSIS — I2510 Atherosclerotic heart disease of native coronary artery without angina pectoris: Secondary | ICD-10-CM | POA: Diagnosis not present

## 2014-12-31 DIAGNOSIS — Z8739 Personal history of other diseases of the musculoskeletal system and connective tissue: Secondary | ICD-10-CM | POA: Diagnosis not present

## 2014-12-31 DIAGNOSIS — I252 Old myocardial infarction: Secondary | ICD-10-CM | POA: Diagnosis not present

## 2014-12-31 DIAGNOSIS — W01198A Fall on same level from slipping, tripping and stumbling with subsequent striking against other object, initial encounter: Secondary | ICD-10-CM | POA: Diagnosis not present

## 2014-12-31 DIAGNOSIS — Y9389 Activity, other specified: Secondary | ICD-10-CM | POA: Insufficient documentation

## 2014-12-31 DIAGNOSIS — F0280 Dementia in other diseases classified elsewhere without behavioral disturbance: Secondary | ICD-10-CM | POA: Diagnosis not present

## 2014-12-31 NOTE — ED Provider Notes (Signed)
CSN: [id number]     Arrival date & time 12/31/14  1322 History   First MD Initiated Contact with Patient 12/31/14 1342     Chief Complaint  Patient presents with  . Fall     (Consider location/radiation/quality/duration/timing/severity/associated sxs/prior Treatment) HPI Comments: 81 year old female presents with right sided head injury prior to arrival. Patient's son was getting her into the car and he left her side briefly and she fell and hit her head on the right side, no loss of consciousness no vomiting. Patient had baseline with mild dementia. No blood thinners. No other injuries or complaints of pain. Patient has been at her baseline recently with activities, uses walker. Nonsmoker.  Patient is a 81 y.o. female presenting with fall. The history is provided by the patient and a relative.  Fall Associated symptoms include headaches. Pertinent negatives include no chest pain, no abdominal pain and no shortness of breath.    Past Medical History  Diagnosis Date  . Alzheimer disease   . Hypertension   . CHF (congestive heart failure)   . Arthritis   . Thyroid disease   . Myocardial infarct 2001  . S/P colonoscopy Apr 2004    left-sided diverticula, internal hemorrhoids (RMR)  . Hip pain   . Skin rash   . Cholesterol blood decreased   . Trouble in sleeping   . Anxiety   . Coronary artery disease    Past Surgical History  Procedure Laterality Date  . Mastoidectomy    . Colonoscopy  12/20/02    internal hemorrhoids otherwise normal/left-side diverticula  . Colonoscopy  10/16/2011    Procedure: COLONOSCOPY;  Surgeon: Daneil Dolin, MD;  Location: [facility];  Service: Endoscopy;  Laterality: N/A;  10:00- pts family request this time    Family History  Problem Relation Age of Onset  . Colon cancer Sister     Stage II, in remission  . Colon cancer      nephew; Stage IV   History  Substance Use Topics  . Smoking status: Never Smoker   . Smokeless tobacco: Never Used   . Alcohol Use: No   OB History    Gravida Para Term Preterm AB TAB SAB Ectopic Multiple Living   2 2 2       2      Review of Systems  Constitutional: Negative for fever and chills.  HENT: Negative for congestion.   Eyes: Negative for visual disturbance.  Respiratory: Negative for shortness of breath.   Cardiovascular: Negative for chest pain.  Gastrointestinal: Negative for vomiting and abdominal pain.  Genitourinary: Negative for dysuria and flank pain.  Musculoskeletal: Negative for back pain, neck pain and neck stiffness.  Skin: Positive for wound. Negative for rash.  Neurological: Positive for headaches. Negative for light-headedness.      Allergies  Review of patient's allergies indicates no known allergies.  Home Medications   Prior to Admission medications   Medication Sig Start Date End Date Taking? Authorizing Provider  acetaminophen (TYLENOL) 325 MG tablet Take 325 mg by mouth every 6 (six) hours as needed. For pain   Yes Historical Provider, MD  furosemide (LASIX) 40 MG tablet Take 20 mg by mouth daily.    Yes Historical Provider, MD  metoprolol tartrate (LOPRESSOR) 25 MG tablet Take 12.5 mg by mouth 2 (two) times daily.     Yes Historical Provider, MD  Multiple Vitamins-Minerals (MULTIVITAMIN WITH MINERALS) tablet Take 1 tablet by mouth daily.     Yes Historical Provider,  MD  sertraline (ZOLOFT) 50 MG tablet Take 50 mg by mouth daily. 04/14/13  Yes Historical Provider, MD  spironolactone (ALDACTONE) 25 MG tablet Take 25 mg by mouth daily.    Yes Historical Provider, MD   BP 112/98 mmHg  Pulse 118  Temp(Src) 98.7 F (37.1 C) (Oral)  Resp 16  Wt 95 lb (43.092 kg)  SpO2 94% Physical Exam  Constitutional: She is oriented to person, place, and time. She appears well-developed and well-nourished.  HENT:  Head: Normocephalic.  Patient has hematoma right temporal region, mild tender, ecchymotic. No step-off. Neck supple no midline tenderness cervical thoracic  region.  Eyes: Conjunctivae are normal. Right eye exhibits no discharge. Left eye exhibits no discharge.  Neck: Normal range of motion. Neck supple. No tracheal deviation present.  Cardiovascular: Normal rate and regular rhythm.   Pulmonary/Chest: Effort normal and breath sounds normal.  Abdominal: Soft. She exhibits no distension. There is no tenderness. There is no guarding.  Musculoskeletal: She exhibits tenderness. She exhibits no edema.  Patient has general weakness bilateral which is her baseline, no focal weakness, no focal tenderness in major joints with range of motion of hips and shoulders.  Neurological: She is alert and oriented to person, place, and time.  Skin: Skin is warm. No rash noted.  Psychiatric: She has a normal mood and affect.  Nursing note and vitals reviewed.   ED Course  Procedures (including critical care time) Labs Review Labs Reviewed - No data to display  Imaging Review Ct Head Wo Contrast  12/31/2014   CLINICAL DATA:  Per family patient fell today while trying to get into truck. Patient c/o pain on right side of head. Denies any LOC. Denies any dizziness. Patient has hematoma to right side of head. Denies taking any type of blood thinner. hx of Alzheimers,HTN,CHF,MI,CAD/ Best exam obtainable  EXAM: CT HEAD WITHOUT CONTRAST  TECHNIQUE: Contiguous axial images were obtained from the base of the skull through the vertex without intravenous contrast.  COMPARISON:  12/13/2011  FINDINGS: Right frontal scalp hematoma.  No underlying fracture.  Ventricles are normal in configuration. There is ventricular and sulcal enlargement reflecting moderate atrophy. There are no parenchymal masses or mass effect. No evidence of a cortical infarct. Patchy white matter hypoattenuation is noted consistent with moderate chronic microvascular ischemic change.  There are no extra-axial masses or abnormal fluid collections.  No intracranial hemorrhage.  Visualized sinuses and mastoid air  cells are clear.  IMPRESSION: 1. No acute intracranial abnormalities. 2. Right frontal scalp hematoma.   Electronically Signed   By: Lajean Manes M.D.   On: 12/31/2014 14:13     EKG Interpretation None      MDM   Final diagnoses:  Scalp hematoma, initial encounter  Acute head injury, initial encounter   Patient presents after low risk fall however with age and hematoma CT scan ordered, results reviewed no acute findings. Patient requesting to go home, clinically was mechanical.  Family comfortable with outpatient follow-up.  Results and differential diagnosis were discussed with the patient/parent/guardian. Close follow up outpatient was discussed, comfortable with the plan.   Medications - No data to display  Filed Vitals:   12/31/14 1325 12/31/14 1610 12/31/14 1618  BP: 131/65 96/47 112/98  Pulse: 108 118   Temp: 98.6 F (37 C) 98.7 F (37.1 C)   TempSrc: Oral    Resp: 16 16   Weight: 95 lb (43.092 kg)    SpO2: 96% 94%     Final diagnoses:  Scalp hematoma, initial encounter  Acute head injury, initial encounter        Elnora Morrison, MD 12/31/14 1733

## 2014-12-31 NOTE — ED Notes (Signed)
Per family patient fell today while trying to get into truck. Patient c/o pain on right side of head. Denies any LOC. Denies any dizziness. Patient has hematoma to right side of head. Denies taking any type of blood thinner.

## 2014-12-31 NOTE — Discharge Instructions (Signed)
If you were given medicines take as directed.  If you are on coumadin or contraceptives realize their levels and effectiveness is altered by many different medicines.  If you have any reaction (rash, tongues swelling, other) to the medicines stop taking and see a physician.   Please follow up as directed and return to the ER or see a physician for new or worsening symptoms.  Thank you. Filed Vitals:   12/31/14 1325  BP: 131/65  Pulse: 108  Temp: 98.6 F (37 C)  TempSrc: Oral  Resp: 16  Weight: 95 lb (43.092 kg)  SpO2: 96%

## 2015-02-01 ENCOUNTER — Ambulatory Visit (INDEPENDENT_AMBULATORY_CARE_PROVIDER_SITE_OTHER): Payer: Medicare Other | Admitting: Otolaryngology

## 2015-02-01 DIAGNOSIS — H6123 Impacted cerumen, bilateral: Secondary | ICD-10-CM | POA: Diagnosis not present

## 2015-03-26 DIAGNOSIS — S12100A Unspecified displaced fracture of second cervical vertebra, initial encounter for closed fracture: Secondary | ICD-10-CM

## 2015-03-26 HISTORY — DX: Unspecified displaced fracture of second cervical vertebra, initial encounter for closed fracture: S12.100A

## 2015-04-10 ENCOUNTER — Emergency Department (HOSPITAL_COMMUNITY): Payer: Medicare Other

## 2015-04-10 ENCOUNTER — Emergency Department (HOSPITAL_COMMUNITY)
Admission: EM | Admit: 2015-04-10 | Discharge: 2015-04-10 | Disposition: A | Discharge status: Home / Self Care | Payer: Medicare Other | Attending: Emergency Medicine | Admitting: Emergency Medicine

## 2015-04-10 ENCOUNTER — Encounter (HOSPITAL_COMMUNITY): Payer: Self-pay | Admitting: *Deleted

## 2015-04-10 DIAGNOSIS — Y998 Other external cause status: Secondary | ICD-10-CM | POA: Insufficient documentation

## 2015-04-10 DIAGNOSIS — S8012XA Contusion of left lower leg, initial encounter: Secondary | ICD-10-CM | POA: Diagnosis not present

## 2015-04-10 DIAGNOSIS — F0280 Dementia in other diseases classified elsewhere without behavioral disturbance: Secondary | ICD-10-CM | POA: Insufficient documentation

## 2015-04-10 DIAGNOSIS — Y9389 Activity, other specified: Secondary | ICD-10-CM | POA: Insufficient documentation

## 2015-04-10 DIAGNOSIS — R011 Cardiac murmur, unspecified: Secondary | ICD-10-CM | POA: Insufficient documentation

## 2015-04-10 DIAGNOSIS — W1839XA Other fall on same level, initial encounter: Secondary | ICD-10-CM | POA: Diagnosis not present

## 2015-04-10 DIAGNOSIS — Y92003 Bedroom of unspecified non-institutional (private) residence as the place of occurrence of the external cause: Secondary | ICD-10-CM | POA: Diagnosis not present

## 2015-04-10 DIAGNOSIS — S40021A Contusion of right upper arm, initial encounter: Secondary | ICD-10-CM | POA: Insufficient documentation

## 2015-04-10 DIAGNOSIS — I10 Essential (primary) hypertension: Secondary | ICD-10-CM | POA: Insufficient documentation

## 2015-04-10 DIAGNOSIS — S81812A Laceration without foreign body, left lower leg, initial encounter: Secondary | ICD-10-CM | POA: Diagnosis not present

## 2015-04-10 DIAGNOSIS — I252 Old myocardial infarction: Secondary | ICD-10-CM | POA: Insufficient documentation

## 2015-04-10 DIAGNOSIS — S8011XA Contusion of right lower leg, initial encounter: Secondary | ICD-10-CM | POA: Insufficient documentation

## 2015-04-10 DIAGNOSIS — S40022A Contusion of left upper arm, initial encounter: Secondary | ICD-10-CM | POA: Diagnosis not present

## 2015-04-10 DIAGNOSIS — Z8739 Personal history of other diseases of the musculoskeletal system and connective tissue: Secondary | ICD-10-CM | POA: Insufficient documentation

## 2015-04-10 DIAGNOSIS — Z8639 Personal history of other endocrine, nutritional and metabolic disease: Secondary | ICD-10-CM | POA: Diagnosis not present

## 2015-04-10 DIAGNOSIS — G309 Alzheimer's disease, unspecified: Secondary | ICD-10-CM | POA: Diagnosis not present

## 2015-04-10 DIAGNOSIS — Z79899 Other long term (current) drug therapy: Secondary | ICD-10-CM | POA: Insufficient documentation

## 2015-04-10 DIAGNOSIS — I2510 Atherosclerotic heart disease of native coronary artery without angina pectoris: Secondary | ICD-10-CM | POA: Diagnosis not present

## 2015-04-10 DIAGNOSIS — I509 Heart failure, unspecified: Secondary | ICD-10-CM | POA: Insufficient documentation

## 2015-04-10 DIAGNOSIS — S0181XA Laceration without foreign body of other part of head, initial encounter: Secondary | ICD-10-CM | POA: Diagnosis not present

## 2015-04-10 DIAGNOSIS — S0990XA Unspecified injury of head, initial encounter: Secondary | ICD-10-CM | POA: Diagnosis present

## 2015-04-10 DIAGNOSIS — W19XXXA Unspecified fall, initial encounter: Secondary | ICD-10-CM

## 2015-04-10 HISTORY — DX: Abdominal aortic aneurysm, without rupture, unspecified: I71.40

## 2015-04-10 HISTORY — DX: Abdominal aortic aneurysm, without rupture: I71.4

## 2015-04-10 LAB — CBC WITH DIFFERENTIAL/PLATELET
BASOS ABS: 0 10*3/uL (ref 0.0–0.1)
Basophils Relative: 0 % (ref 0–1)
EOS ABS: 0 10*3/uL (ref 0.0–0.7)
EOS PCT: 0 % (ref 0–5)
HCT: 37.8 % (ref 36.0–46.0)
Hemoglobin: 12.8 g/dL (ref 12.0–15.0)
Lymphocytes Relative: 13 % (ref 12–46)
Lymphs Abs: 1.3 10*3/uL (ref 0.7–4.0)
MCH: 33.2 pg (ref 26.0–34.0)
MCHC: 33.9 g/dL (ref 30.0–36.0)
MCV: 98.2 fL (ref 78.0–100.0)
Monocytes Absolute: 0.9 10*3/uL (ref 0.1–1.0)
Monocytes Relative: 8 % (ref 3–12)
Neutro Abs: 8.3 10*3/uL — ABNORMAL HIGH (ref 1.7–7.7)
Neutrophils Relative %: 79 % — ABNORMAL HIGH (ref 43–77)
PLATELETS: 203 10*3/uL (ref 150–400)
RBC: 3.85 MIL/uL — AB (ref 3.87–5.11)
RDW: 13.9 % (ref 11.5–15.5)
WBC: 10.4 10*3/uL (ref 4.0–10.5)

## 2015-04-10 LAB — BASIC METABOLIC PANEL
ANION GAP: 10 (ref 5–15)
BUN: 12 mg/dL (ref 6–20)
CO2: 28 mmol/L (ref 22–32)
Calcium: 8.4 mg/dL — ABNORMAL LOW (ref 8.9–10.3)
Chloride: 95 mmol/L — ABNORMAL LOW (ref 101–111)
Creatinine, Ser: 0.69 mg/dL (ref 0.44–1.00)
GFR calc Af Amer: >60 mL/min (ref >60)
Glucose, Bld: 118 mg/dL — ABNORMAL HIGH (ref 65–99)
POTASSIUM: 3.4 mmol/L — AB (ref 3.5–5.1)
SODIUM: 133 mmol/L — AB (ref 135–145)

## 2015-04-10 LAB — CK: CK TOTAL: 84 U/L (ref 38–234)

## 2015-04-10 MED ORDER — HYDROGEN PEROXIDE 3 % EX SOLN
CUTANEOUS | Status: AC
  Filled 2015-04-10: qty 473

## 2015-04-10 NOTE — ED Notes (Signed)
Pt's son reports that he found pt on floor this morning around 0600 with blood around her. Pt denies any LOC. Pt presents with skin tear to left lower leg and 2 areas of abrasions to posterior area of head. Pt has congested cough, non productive per pt and son that has been going on for about 1 week.

## 2015-04-10 NOTE — ED Provider Notes (Signed)
The patient is an elderly 81 year old female who lives with her son, she had a fall last night while ambulating striking the back of her head on what the son thinks was a stool. The patient has no complaints, she has normal range of motion of all 4 extremities at her baseline, she has soft compartments, supple joints, soft abdomen. She has a loud heart murmur, her lung sounds are rhonchorous but is in no distress. Neurologically the patient is able to follow commands, she is demented.  Labs and imaging reviewed, no acute findings to suggest need for further intervention. I discussed with the son her disposition, he states that he wants to take her home and they do not plan on using nursing home facilities until she is bedridden. We'll discharge home in the care of her son. Wound care to the small laceration on the back of her head, this is subcentimeter and not in need of repair. The son expresses his understanding.    I saw and evaluated the patient, reviewed the resident's note and I agree with the findings and plan.   EKG Interpretation  Date/Time:  Tuesday April 10 2015 07:08:54 EDT Ventricular Rate:  100 PR Interval:  244 QRS Duration: 79 QT Interval:  348 QTC Calculation: 449 R Axis:   54 Text Interpretation:  Sinus tachycardia Prolonged PR interval Borderline T wave abnormalities Since last tracing rate faster Confirmed by Belma Dyches  MD, Arkel Cartwright ([id number]) on 04/10/2015 8:47:22 AM       I personally interpreted the EKG as well as the resident and agree with the interpretation on the resident's chart.  Final diagnoses:  Fall, initial encounter          Barbara Chapel, MD 04/11/15 [phone]

## 2015-04-10 NOTE — ED Provider Notes (Signed)
CSN: [id number]     Arrival date & time 04/10/15  8315 History   None    Chief Complaint  Patient presents with  . Fall   HPI  Barbara Snyder is a 81yo female presenting today following a fall. Son found patient on floor this morning at 6am with blood around her. States there was blood in the hall and he found her in her bedroom on the floor, unsure how she had gotten there. She denies loss of consciousness, however she is demented. Skin tear to left lower leg and two areas of abrasions to posterior head. Denies any changes in behavior or cognition. Has history of two previous falls resulting in bruising on arms and on left face. Family states these are due to tripping over her walker. Family also notes long history of CHF and wheezing with cough and denies that present cough is acute in nature. History obtained from son due to severely demented state.   Past Medical History  Diagnosis Date  . Alzheimer disease   . Hypertension   . CHF (congestive heart failure)   . Arthritis   . Thyroid disease   . Myocardial infarct 2001  . S/P colonoscopy Apr 2004    left-sided diverticula, internal hemorrhoids (RMR)  . Hip pain   . Skin rash   . Cholesterol blood decreased   . Trouble in sleeping   . Anxiety   . Coronary artery disease    Past Surgical History  Procedure Laterality Date  . Mastoidectomy    . Colonoscopy  12/20/02    internal hemorrhoids otherwise normal/left-side diverticula  . Colonoscopy  10/16/2011    Procedure: COLONOSCOPY;  Surgeon: Daneil Dolin, MD;  Location: [facility];  Service: Endoscopy;  Laterality: N/A;  10:00- pts family request this time    Family History  Problem Relation Age of Onset  . Colon cancer Sister     Stage II, in remission  . Colon cancer      nephew; Stage IV   Social History  Substance Use Topics  . Smoking status: Never Smoker   . Smokeless tobacco: Never Used  . Alcohol Use: No   OB History    Gravida Para Term Preterm AB TAB SAB  Ectopic Multiple Living   2 2 2       2      Review of Systems  Constitutional: Negative for fever.  Respiratory: Positive for cough and wheezing.   Musculoskeletal: Negative for arthralgias.  Skin: Positive for wound.      Allergies  Review of patient's allergies indicates no known allergies.  Home Medications   Prior to Admission medications   Medication Sig Start Date End Date Taking? Authorizing Provider  acetaminophen (TYLENOL) 325 MG tablet Take 325 mg by mouth every 6 (six) hours as needed. For pain    Historical Provider, MD  furosemide (LASIX) 40 MG tablet Take 20 mg by mouth daily.     Historical Provider, MD  metoprolol tartrate (LOPRESSOR) 25 MG tablet Take 12.5 mg by mouth 2 (two) times daily.      Historical Provider, MD  Multiple Vitamins-Minerals (MULTIVITAMIN WITH MINERALS) tablet Take 1 tablet by mouth daily.      Historical Provider, MD  sertraline (ZOLOFT) 50 MG tablet Take 50 mg by mouth daily. 04/14/13   Historical Provider, MD  spironolactone (ALDACTONE) 25 MG tablet Take 25 mg by mouth daily.     Historical Provider, MD   BP 142/96 mmHg  Pulse 96  Temp(Src) 98.5 F (36.9 C) (Oral)  Resp 24  Ht 5' (1.524 m)  Wt 93 lb (42.185 kg)  BMI 18.16 kg/m2  SpO2 94% Physical Exam  Constitutional: She appears well-developed and well-nourished. No distress.  Eyes: Pupils are equal, round, and reactive to light.  Cardiovascular: Normal rate and regular rhythm.   Murmur heard. Distant heart sounds  Pulmonary/Chest: Effort normal. No respiratory distress.  Rhonchi bilaterally  Abdominal: Soft. Bowel sounds are normal. She exhibits no distension. There is no tenderness.  Musculoskeletal: She exhibits no edema.  Contractures of hands bilaterally noted. No tenderness noted over legs, hips, arms, or shoulders.  Neurological:  Demented  Skin:  Laceration noted on right and left posterior head. Skin tear on left leg immediately distal to knee. Bruising on left face.  Bruising on upper and lower extremities bilaterally.    ED Course  Procedures (including critical care time) Labs Review Labs Reviewed  CBC WITH DIFFERENTIAL/PLATELET - Abnormal; Notable for the following:    RBC 3.85 (*)    Neutrophils Relative % 79 (*)    Neutro Abs 8.3 (*)    All other components within normal limits  BASIC METABOLIC PANEL - Abnormal; Notable for the following:    Sodium 133 (*)    Potassium 3.4 (*)    Chloride 95 (*)    Glucose, Bld 118 (*)    Calcium 8.4 (*)    All other components within normal limits  CK  URINALYSIS, ROUTINE W REFLEX MICROSCOPIC (NOT AT [facility])   Imaging Review Ct Head Wo Contrast  04/10/2015   CLINICAL DATA:  Fall earlier today.  History of Alzheimer's dementia  EXAM: CT HEAD WITHOUT CONTRAST  TECHNIQUE: Contiguous axial images were obtained from the base of the skull through the vertex without intravenous contrast.  COMPARISON:  Dec 31, 2014  FINDINGS: Moderate diffuse atrophy is stable. There is no intracranial mass, hemorrhage, extra-axial fluid collection, or midline shift. There is confluent small vessel disease in the centra semiovale bilaterally, stable. There is no new gray-white compartment lesion. No acute infarct evident. There is a left occipital scalp hematoma. The bony calvarium appears intact. Mastoid air cells are clear.  IMPRESSION: Atrophy with periventricular small vessel disease, stable. No intracranial mass, hemorrhage, or extra-axial fluid collection. No acute appearing infarct. Left occipital scalp hematoma. No fractures evident.   Electronically Signed   By: Lowella Grip III M.D.   On: 04/10/2015 08:40   Dg Chest Portable 1 View  04/10/2015   CLINICAL DATA:  Cough and congestion.  Recent fall  EXAM: PORTABLE CHEST - 1 VIEW  COMPARISON:  November 29, 2009  FINDINGS: Patient is somewhat rotated, stable. There is no edema or consolidation. The heart size and pulmonary vascularity are normal. No adenopathy. There is  atherosclerotic change throughout the aorta. No adenopathy. There is arthropathy in both shoulders with evidence of chronic rotator cuff tears.  IMPRESSION: No edema or consolidation.  No change in cardiac silhouette.   Electronically Signed   By: Lowella Grip III M.D.   On: 04/10/2015 08:09   I, Junie Panning, personally reviewed and evaluated these images and lab results as part of my medical decision-making.   EKG Interpretation   Date/Time:  Tuesday April 10 2015 07:08:54 EDT Ventricular Rate:  100 PR Interval:  244 QRS Duration: 79 QT Interval:  348 QTC Calculation: 449 R Axis:   54 Text Interpretation:  Sinus tachycardia Prolonged PR interval Borderline T  wave abnormalities Since last tracing rate  faster Confirmed by MILLER  MD,  [place] ([id number]) on 04/10/2015 8:47:22 AM      MDM   Final diagnoses:  None  Barbara Snyder is a 81yo female presenting today after fall. Unknown how long she was on floor. Will obtain BMP, CBC, CK, and Urinalysis. Will obtain CXR and CT head without contrast.   CBC normal. BMP with mild hyponatremia (133), mild hypokalemia (3.4), mild hypochloremia (95), and mild hyperglycemia (118). CK normal. CT head with small vessel disease, no acute fractures or hemorrhages.  Stable for discharge. Wound care for lacerations to head and skin tear on left leg. Follow up with PCP.    [place], [place] 04/10/15 [place], MD 04/11/15 [phone]

## 2015-04-10 NOTE — Discharge Instructions (Signed)

## 2015-04-15 ENCOUNTER — Encounter (HOSPITAL_COMMUNITY): Payer: Self-pay | Admitting: Emergency Medicine

## 2015-04-15 ENCOUNTER — Emergency Department (HOSPITAL_COMMUNITY)
Admission: EM | Admit: 2015-04-15 | Discharge: 2015-04-15 | Disposition: A | Discharge status: Home / Self Care | Payer: Medicare Other | Attending: Emergency Medicine | Admitting: Emergency Medicine

## 2015-04-15 ENCOUNTER — Emergency Department (HOSPITAL_COMMUNITY): Payer: Medicare Other

## 2015-04-15 DIAGNOSIS — G309 Alzheimer's disease, unspecified: Secondary | ICD-10-CM | POA: Diagnosis not present

## 2015-04-15 DIAGNOSIS — Y9389 Activity, other specified: Secondary | ICD-10-CM | POA: Insufficient documentation

## 2015-04-15 DIAGNOSIS — Z8739 Personal history of other diseases of the musculoskeletal system and connective tissue: Secondary | ICD-10-CM | POA: Insufficient documentation

## 2015-04-15 DIAGNOSIS — I252 Old myocardial infarction: Secondary | ICD-10-CM | POA: Diagnosis not present

## 2015-04-15 DIAGNOSIS — F419 Anxiety disorder, unspecified: Secondary | ICD-10-CM | POA: Insufficient documentation

## 2015-04-15 DIAGNOSIS — I10 Essential (primary) hypertension: Secondary | ICD-10-CM | POA: Insufficient documentation

## 2015-04-15 DIAGNOSIS — W01198D Fall on same level from slipping, tripping and stumbling with subsequent striking against other object, subsequent encounter: Secondary | ICD-10-CM | POA: Diagnosis not present

## 2015-04-15 DIAGNOSIS — Y998 Other external cause status: Secondary | ICD-10-CM | POA: Diagnosis not present

## 2015-04-15 DIAGNOSIS — S199XXA Unspecified injury of neck, initial encounter: Secondary | ICD-10-CM | POA: Diagnosis present

## 2015-04-15 DIAGNOSIS — Z8639 Personal history of other endocrine, nutritional and metabolic disease: Secondary | ICD-10-CM | POA: Insufficient documentation

## 2015-04-15 DIAGNOSIS — R296 Repeated falls: Secondary | ICD-10-CM

## 2015-04-15 DIAGNOSIS — I509 Heart failure, unspecified: Secondary | ICD-10-CM | POA: Diagnosis not present

## 2015-04-15 DIAGNOSIS — S12190A Other displaced fracture of second cervical vertebra, initial encounter for closed fracture: Secondary | ICD-10-CM | POA: Diagnosis not present

## 2015-04-15 DIAGNOSIS — R0902 Hypoxemia: Secondary | ICD-10-CM

## 2015-04-15 DIAGNOSIS — S12100A Unspecified displaced fracture of second cervical vertebra, initial encounter for closed fracture: Secondary | ICD-10-CM

## 2015-04-15 DIAGNOSIS — Z79899 Other long term (current) drug therapy: Secondary | ICD-10-CM | POA: Insufficient documentation

## 2015-04-15 DIAGNOSIS — Y92009 Unspecified place in unspecified non-institutional (private) residence as the place of occurrence of the external cause: Secondary | ICD-10-CM | POA: Insufficient documentation

## 2015-04-15 DIAGNOSIS — I2510 Atherosclerotic heart disease of native coronary artery without angina pectoris: Secondary | ICD-10-CM | POA: Insufficient documentation

## 2015-04-15 DIAGNOSIS — W19XXXD Unspecified fall, subsequent encounter: Secondary | ICD-10-CM

## 2015-04-15 NOTE — ED Notes (Addendum)
Aspen collar not available. Per EDP applied philly collar size smalll with EDP at bedside with approval of placement. Pt's son verbalized understanding of teaching for collar placement and maintenance until further instructions from neurosurgery appointment.

## 2015-04-15 NOTE — ED Provider Notes (Signed)
CSN: [id number]     Arrival date & time 04/15/15  0907 History   First MD Initiated Contact with Patient 04/15/15 [phone]     Chief Complaint  Patient presents with  . Fall      Patient is a 81 y.o. female presenting with fall. The history is provided by the patient, a caregiver and a relative. The history is limited by the condition of the patient (Hx Dementia).  Fall  Pt was seen at [facility]. Per pt and her son: Pt fell this morning, hitting her head against the side of the bathtub. Pt has been seen in the ED multiple times previously for falls. Pt has significant hx of dementia and keeps repeating "I just want to go home now," "take me home." Pt herself denies any complaints.    Past Medical History  Diagnosis Date  . Alzheimer disease   . Hypertension   . CHF (congestive heart failure)   . Arthritis   . Thyroid disease   . Myocardial infarct 2001  . S/P colonoscopy Apr 2004    left-sided diverticula, internal hemorrhoids (RMR)  . Hip pain   . Skin rash   . Cholesterol blood decreased   . Trouble in sleeping   . Anxiety   . Coronary artery disease   . Abdominal aortic aneurysm    Past Surgical History  Procedure Laterality Date  . Mastoidectomy    . Colonoscopy  12/20/02    internal hemorrhoids otherwise normal/left-side diverticula  . Colonoscopy  10/16/2011    Procedure: COLONOSCOPY;  Surgeon: Daneil Dolin, MD;  Location: [facility];  Service: Endoscopy;  Laterality: N/A;  10:00- pts family request this time    Family History  Problem Relation Age of Onset  . Colon cancer Sister     Stage II, in remission  . Colon cancer      nephew; Stage IV   Social History  Substance Use Topics  . Smoking status: Never Smoker   . Smokeless tobacco: Never Used  . Alcohol Use: No   OB History    Gravida Para Term Preterm AB TAB SAB Ectopic Multiple Living   2 2 2       2      Review of Systems  Unable to perform ROS: Dementia     Allergies  Review of patient's  allergies indicates no known allergies.  Home Medications   Prior to Admission medications   Medication Sig Start Date End Date Taking? Authorizing Provider  acetaminophen (TYLENOL) 325 MG tablet Take 325 mg by mouth every 6 (six) hours as needed. For pain   Yes Historical Provider, MD  furosemide (LASIX) 40 MG tablet Take 20 mg by mouth daily.    Yes Historical Provider, MD  metoprolol tartrate (LOPRESSOR) 25 MG tablet Take 12.5 mg by mouth 2 (two) times daily.     Yes Historical Provider, MD  Multiple Vitamins-Minerals (MULTIVITAMIN WITH MINERALS) tablet Take 1 tablet by mouth daily.     Yes Historical Provider, MD  sertraline (ZOLOFT) 50 MG tablet Take 50 mg by mouth daily. 04/14/13  Yes Historical Provider, MD  spironolactone (ALDACTONE) 25 MG tablet Take 25 mg by mouth daily.    Yes Historical Provider, MD   BP 145/73 mmHg  Pulse 80  Temp(Src) 98.3 F (36.8 C) (Oral)  Resp 22  Ht 4\' 9"  (1.448 m)  Wt 93 lb (42.185 kg)  BMI 20.12 kg/m2  SpO2 88% Physical Exam  0945: Physical examination: Vital signs and  O2 SAT: Reviewed; Constitutional: Well developed, Well nourished, Well hydrated, In no acute distress; Head and Face: Normocephalic, +contusion to posterior scalp. No laceration. +fading ecchymosis to left face.; Eyes: EOMI, PERRL, No scleral icterus; ENMT: Mouth and pharynx normal, Mucous membranes moist; Neck: Trachea midline; Spine: No abrasions or ecchymosis. No midline CS, TS, LS tenderness.; Cardiovascular: Regular rate and rhythm, No gallop; Respiratory: Breath sounds clear & equal bilaterally, No wheezes, Normal respiratory effort/excursion; Chest: Nontender, No deformity, Movement normal, No crepitus, No abrasions or ecchymosis.; Abdomen: Soft, Nontender, Nondistended, Normal bowel sounds, No abrasions or ecchymosis.; Genitourinary: No CVA tenderness; Extremities: No deformity, Full range of motion major/large joints of bilat UE's and LE's without pain or tenderness to palp,  Neurovascularly intact, Pulses normal, No tenderness, No edema, Pelvis stable; Neuro: Awake, alert, confused re: time, place, events per hx dementia. No facial droop. Speech clear. Grips equal. Strength 5/5 equal bilat UE's and LE's.; Skin: Color normal, Warm, Dry   ED Course  Procedures (including critical care time) Labs Review   Imaging Review  I have personally reviewed and evaluated these images and lab results as part of my medical decision-making.   EKG Interpretation None      MDM  MDM Reviewed: previous chart, nursing note and vitals Reviewed previous: CT scan and labs Interpretation: CT scan      Results for orders placed or performed during the hospital encounter of 04/10/15  CBC with Differential  Result Value Ref Range   WBC 10.4 4.0 - 10.5 K/uL   RBC 3.85 (L) 3.87 - 5.11 MIL/uL   Hemoglobin 12.8 12.0 - 15.0 g/dL   HCT 37.8 36.0 - 46.0 %   MCV 98.2 78.0 - 100.0 fL   MCH 33.2 26.0 - 34.0 pg   MCHC 33.9 30.0 - 36.0 g/dL   RDW 13.9 11.5 - 15.5 %   Platelets 203 150 - 400 K/uL   Neutrophils Relative % 79 (H) 43 - 77 %   Neutro Abs 8.3 (H) 1.7 - 7.7 K/uL   Lymphocytes Relative 13 12 - 46 %   Lymphs Abs 1.3 0.7 - 4.0 K/uL   Monocytes Relative 8 3 - 12 %   Monocytes Absolute 0.9 0.1 - 1.0 K/uL   Eosinophils Relative 0 0 - 5 %   Eosinophils Absolute 0.0 0.0 - 0.7 K/uL   Basophils Relative 0 0 - 1 %   Basophils Absolute 0.0 0.0 - 0.1 K/uL  Basic metabolic panel  Result Value Ref Range   Sodium 133 (L) 135 - 145 mmol/L   Potassium 3.4 (L) 3.5 - 5.1 mmol/L   Chloride 95 (L) 101 - 111 mmol/L   CO2 28 22 - 32 mmol/L   Glucose, Bld 118 (H) 65 - 99 mg/dL   BUN 12 6 - 20 mg/dL   Creatinine, Ser 0.69 0.44 - 1.00 mg/dL   Calcium 8.4 (L) 8.9 - 10.3 mg/dL   GFR calc non Af Amer >60 >60 mL/min   GFR calc Af Amer >60 >60 mL/min   Anion gap 10 5 - 15  CK  Result Value Ref Range   Total CK 84 38 - 234 U/L   Dg Chest 2 View 04/15/2015   CLINICAL DATA:  Fall.   Initial encounter.  EXAM: CHEST  2 VIEW  COMPARISON:  04/10/2015.  FINDINGS: Severe dextroconvex thoracic scoliosis. Osteopenia. Tortuous thoracic aorta. Atherosclerosis. Bilateral chronic rotator cuff tears with remodeling.  No displaced rib fractures or pneumothorax in this patient with recent fall.  Clavicles appear intact.  The cardiopericardial silhouette is unchanged and within normal limits. Retrocardiac density compatible with atelectasis. Diffuse nonspecific age related interstitial prominence.  IMPRESSION: No acute cardiopulmonary disease. Chronic changes of the chest without interval change.   Electronically Signed   By: Dereck Ligas M.D.   On: 04/15/2015 10:37   Ct Head Wo Contrast 04/15/2015   CLINICAL DATA:  Fall. Head trauma. Laceration on the back of the head.  EXAM: CT HEAD WITHOUT CONTRAST  CT CERVICAL SPINE WITHOUT CONTRAST  TECHNIQUE: Multidetector CT imaging of the head and cervical spine was performed following the standard protocol without intravenous contrast. Multiplanar CT image reconstructions of the cervical spine were also generated.  COMPARISON:  Head CT 04/10/2015. Other priors dating back to 2012. No comparison cervical spine CT or MRI.  FINDINGS: CT HEAD FINDINGS  No mass lesion, mass effect, midline shift, hydrocephalus, hemorrhage. No acute territorial cortical ischemia/infarct. Atrophy and chronic ischemic white matter disease is present.  Occipital scalp hematoma is present. No underlying skull fracture. Dense intracranial atherosclerosis. The mastoid air cells are within normal limits. Middle ears appear clear. Visible paranasal sinuses normal.  CT CERVICAL SPINE FINDINGS  Alignment: Likely chronic reversal of the normal cervical lordosis. Dextroconvex torticollis is also present. C5-C6 ankylosis with fixed anterolisthesis. Anterolisthesis of C4 on C5 is present measuring 4 mm. Trace anterolisthesis of C3 on C4, also with ankylosis. There also appears to be ankylosis of the  dorsal C2-C3 disc space.  Craniocervical junction: Type 2 odontoid fracture is present with 3 mm posterior displacement and apex anterior angulation. Severe chronic degenerative changes of the RIGHT C1-C2 facet joint with bone loss in the RIGHT C2 superior articular process and the lateral mass of C1.  The odontoid fracture produces at least moderate central stenosis and may be severe central stenosis. Correlation for neurologic symptoms is recommended.  Vertebrae: No other cervical spine fractures are identified. There is a partially visible T3 compression fracture with mild retropulsion. This has some areas of sclerosis suggesting chronicity.  Paraspinal soft tissues: Dense atherosclerosis.  Lung apices: Tortuous calcified aortic arch and branch vessels. No pneumothorax. No displaced rib fractures are identified. Severe LEFT glenohumeral osteoarthritis incidentally noted.  Severe cervical spine degenerative disc and facet disease accounting for the spondylolisthesis. Severe chronic osteoarthritic remodeling of the facet joints at C4-C5 on the LEFT.  IMPRESSION: 1. Atrophy and chronic ischemic white matter disease without acute intracranial abnormality. 2. Occipital scalp hematoma. 3. Mildly displaced and angulated type 2 odontoid fracture with at least moderate central stenosis. 4. Critical Value/emergent results were called by telephone at the time of interpretation on 04/15/2015 at 11:00 am to Dr. Francine Graven , who verbally acknowledged these results. 5. Ankylosis of the upper cervical spine from C2 through C4. 6. Age indeterminate but likely chronic T3 compression fracture partially visible.   Electronically Signed   By: Dereck Ligas M.D.   On: 04/15/2015 11:03   Ct Cervical Spine Wo Contrast 04/15/2015   CLINICAL DATA:  Fall. Head trauma. Laceration on the back of the head.  EXAM: CT HEAD WITHOUT CONTRAST  CT CERVICAL SPINE WITHOUT CONTRAST  TECHNIQUE: Multidetector CT imaging of the head and cervical  spine was performed following the standard protocol without intravenous contrast. Multiplanar CT image reconstructions of the cervical spine were also generated.  COMPARISON:  Head CT 04/10/2015. Other priors dating back to 2012. No comparison cervical spine CT or MRI.  FINDINGS: CT HEAD FINDINGS  No mass lesion, mass effect, midline  shift, hydrocephalus, hemorrhage. No acute territorial cortical ischemia/infarct. Atrophy and chronic ischemic white matter disease is present.  Occipital scalp hematoma is present. No underlying skull fracture. Dense intracranial atherosclerosis. The mastoid air cells are within normal limits. Middle ears appear clear. Visible paranasal sinuses normal.  CT CERVICAL SPINE FINDINGS  Alignment: Likely chronic reversal of the normal cervical lordosis. Dextroconvex torticollis is also present. C5-C6 ankylosis with fixed anterolisthesis. Anterolisthesis of C4 on C5 is present measuring 4 mm. Trace anterolisthesis of C3 on C4, also with ankylosis. There also appears to be ankylosis of the dorsal C2-C3 disc space.  Craniocervical junction: Type 2 odontoid fracture is present with 3 mm posterior displacement and apex anterior angulation. Severe chronic degenerative changes of the RIGHT C1-C2 facet joint with bone loss in the RIGHT C2 superior articular process and the lateral mass of C1.  The odontoid fracture produces at least moderate central stenosis and may be severe central stenosis. Correlation for neurologic symptoms is recommended.  Vertebrae: No other cervical spine fractures are identified. There is a partially visible T3 compression fracture with mild retropulsion. This has some areas of sclerosis suggesting chronicity.  Paraspinal soft tissues: Dense atherosclerosis.  Lung apices: Tortuous calcified aortic arch and branch vessels. No pneumothorax. No displaced rib fractures are identified. Severe LEFT glenohumeral osteoarthritis incidentally noted.  Severe cervical spine  degenerative disc and facet disease accounting for the spondylolisthesis. Severe chronic osteoarthritic remodeling of the facet joints at C4-C5 on the LEFT.  IMPRESSION: 1. Atrophy and chronic ischemic white matter disease without acute intracranial abnormality. 2. Occipital scalp hematoma. 3. Mildly displaced and angulated type 2 odontoid fracture with at least moderate central stenosis. 4. Critical Value/emergent results were called by telephone at the time of interpretation on 04/15/2015 at 11:00 am to Dr. Francine Graven , who verbally acknowledged these results. 5. Ankylosis of the upper cervical spine from C2 through C4. 6. Age indeterminate but likely chronic T3 compression fracture partially visible.   Electronically Signed   By: Dereck Ligas M.D.   On: 04/15/2015 11:03    1125:  T/C to [facility] Neurosurgery Dr. Cyndy Freeze, case discussed, including:  HPI, pertinent PM/SHx, VS/PE, dx testing, ED course and treatment:  He has viewed the CT images, states no acute surgical issue at this time, place in either [place] or [place] hard collar, f/u in office 2-4 weeks. No aspen collar available; [place] collar placed, neuro exam unchanged. Pt's O2 Sats high 80's on R/A are c/w previous values. Workup 5 days ago without acute findings. Will obtain Home Health consult regarding need for possible home O2 and evaluation regarding home safety/frequent falls.  Family would like to take pt home now. Dx and testing d/w pt and family.  Questions answered.  Verb understanding, agreeable to admit.    Francine Graven, DO 04/18/15 [phone]

## 2015-04-15 NOTE — ED Notes (Signed)
Son states pt fell & hit her head on side of a bathtub. No LOC.

## 2015-04-15 NOTE — Discharge Instructions (Signed)
°Emergency Department Resource Guide °1) Find a Doctor and Pay Out of Pocket °Although you won't have to find out who is covered by your insurance plan, it is a good idea to ask around and get recommendations. You will then need to call the office and see if the doctor you have chosen will accept you as a new patient and what types of options they offer for patients who are self-pay. Some doctors offer discounts or will set up payment plans for their patients who do not have insurance, but you will need to ask so you aren't surprised when you get to your appointment. ° °2) Contact Your Local Health Department °Not all health departments have doctors that can see patients for sick visits, but many do, so it is worth a call to see if yours does. If you don't know where your local health department is, you can check in your phone book. The CDC also has a tool to help you locate your state's health department, and many state websites also have listings of all of their local health departments. ° °3) Find a Walk-in Clinic °If your illness is not likely to be very severe or complicated, you may want to try a walk in clinic. These are popping up all over the country in pharmacies, drugstores, and shopping centers. They're usually staffed by nurse practitioners or physician assistants that have been trained to treat common illnesses and complaints. They're usually fairly quick and inexpensive. However, if you have serious medical issues or chronic medical problems, these are probably not your best option. ° °No Primary Care Doctor: °- Call [facility] at  832-8000 - they can help you locate a primary care doctor that  accepts your insurance, provides certain services, etc. °- Physician Referral Service- 1-800-533-3463 ° °Chronic Pain Problems: °Organization         Address  Phone   Notes  °[facility]  ([phone]) [phone] Patients need to be referred by their primary care doctor.  ° °Medication  Assistance: °Organization         Address  Phone   Notes  °[place] [facility] [address] °[place], [place] [address] (336) 641-8030 --Must be a resident of [place] °-- Must have NO insurance coverage whatsoever (no Medicaid/ Medicare, etc.) °-- The pt. MUST have a primary care doctor that directs their care regularly and follows them in the community °  °[facility]  (866) 331-1348   °[employer]  (888) 892-1162   ° °Agencies that provide inexpensive medical care: °Organization         Address  Phone   Notes  °[facility]  (336) 832-8035   °[facility]    (336) 832-7272   °[facility] [address] °[place], [place] [address] (336) 832-4777   °[facility] [address], °[place] (336) 271-4999   °[facility]    (336) 373-0678   °[facility]    (336) 272-1050   °[facility] ° [address], [place] Phone:  (336) 832-4444, Fax:  (336) 832-4440 Hours of Operation:  9 am - 6 pm, M-F.  Also accepts Medicaid/Medicare and self-pay.  °[facility] ° [address], [place] Phone: (336) [phone], Fax: (336) [phone]. Hours of Operation:  8:30 am - 5:30 pm, M-F.  Also accepts Medicaid and self-pay.  °[facility] [place] [address], [place] Phone: (336) 878-6027   °[facility] [address], [place], [place] (336)723-1848, Ext. 123 Mondays & Thursdays: 7-9 AM.  First 15 patients are seen on a first come, first serve basis. °  ° °Medicaid-accepting [place] Providers: ° °Organization         Address  Phone   Notes  °[facility] [address], [place] (336) 641-2100 Also accepts self-pay patients.  °[facility] [address], [place] ° (336) 856-9996   °[facility] [address], [place]  (336) 288-8857   °[facility] [address] [place] [address], [place] (336) 299-7000   °Veita Bland [address], [place]  ° (336) 373-1557 Only accepts [place] Access Medicaid patients after they have their name applied to their card.  ° °Self-Pay (no insurance) in [place]: ° °Organization         Address  Phone   Notes  °Sickle Cell Patients, [facility] [address], [place] (336) 832-1970   °[facility] [address], [place] (336) 832-4400   ° [facility] ° [address], [place] (336) 992-4800   °[facility]/Dr. Osei-Bonsu ° [address] [place] [address], [place] or [address], [place] (336) 841-8500 Phone number for both [place] and [place] locations is the same.  °[facility] [address], [place] (336) 299-0000   °[facility] [address] [place] [address], [place] or [address] (336) 852-7530 °(336) 878-2260   °[facility] [address], [place] (336) 350-1642, phone; (336) 294-5005, fax Sees patients 1st and 3rd Saturday of every month.  Must not qualify for public or private insurance (i.e. Medicaid, Medicare, [place] Health Choice, Veterans' Benefits) • Household income should be no more than 200% of the poverty level •The clinic cannot treat you if you are pregnant or think you are pregnant • Sexually transmitted diseases are not treated at the clinic.  ° ° °Dental Care: °Organization         Address  Phone  Notes  °[place] [facility] [address], [place] (336) 641-6152 Accepts children up to age 21 who are enrolled in Medicaid or [place] Health Choice; pregnant women with a Medicaid card; and children who have applied for Medicaid or [place] Health Choice, but were declined, whose parents can pay a reduced fee at time of service.  °[place]  [facility] [place]  [address], [place] (336) 641-7733 Accepts children up to age 21 who are enrolled in Medicaid or [place] Health Choice; pregnant women with a Medicaid card; and children who have applied for Medicaid or  Health Choice, but were declined, whose parents can pay a reduced fee at time of service.  °[facility] ° [address],  (336) 641-4533 Patients are seen by appointment only. Walk-ins are not accepted. [facility] will see patients 18 years of age and older. °Monday - Tuesday (8am-5pm) °Most Wednesdays (8:30-5pm) °$30 per visit, cash only  °[facility] ° [address], [place] (336) 641-4533 Patients are seen by appointment only. Walk-ins are not accepted. [facility] will see patients 18 years of age and older. °One   Wednesday Evening (Monthly: Volunteer Based).  $30 per visit, cash only  °[facility]  (919) [phone] for adults; Children under age 4, call Graduate Pediatric Dentistry at ([phone]) [phone]. Children aged 4-14, please call ([phone]) [phone] to request a pediatric application. ° Dental services are provided in all areas of dental care including fillings, crowns and bridges, complete and partial dentures, implants, gum treatment, root canals, and extractions. Preventive care is also provided. Treatment is provided to both adults and children. °Patients are selected via a lottery and there is often a waiting list. °  °[facility] [address], °[place] ° (336) 763-8833 [email or web address] °  °[facility] [address], [place], [place] (336)723-1848, Ext. 123 Second and Fourth Thursday of each month, opens at 6:30 AM; Clinic ends at 9 AM.  Patients are seen on a first-come first-served basis, and a limited number are seen during each clinic.  ° °[facility] ° [address], [place], [place] (336) 723-7904    Eligibility Requirements °You must have lived in [place], or [place] counties for at least the last three months. °  You cannot be eligible for state or federal sponsored healthcare insurance, including Veterans Administration, Medicaid, or Medicare. °  You generally cannot be eligible for healthcare insurance through your employer.  °  How to apply: °Eligibility screenings are held every Tuesday and Wednesday afternoon from 1:00 pm until 4:00 pm. You do not need an appointment for the interview!  °[facility] [address], [place], [place] 336-631-2330   °[facility]  336-342-8273   °[facility]  336-703-3100   °[facility]  336-570-6415   ° °Behavioral Health Resources in the Community: °Intensive Outpatient Programs °Organization         Address  Phone  Notes  °[facility] [address], [place], [place] 336-878-6098   °[facility] [address], [place], [place] 336-832-9800   °[facility] [address], [place], [place] ° 336-882-2125   °[facility] [address],  °[place], [place] 1-800-853-5163 or 336-641-4981   °Substance Abuse Resources °Organization         Address  Phone  Notes  °[facility]  336-882-2125   °[facility]  336-784-9470   °[facility]  336-285-9073   °[facility]  336-845-3988   °Residential & Outpatient Substance Abuse Program  1-800-659-3381   °Psychological Services °Organization         Address  Phone  Notes  °[facility]  336- 832-9600   °[facility]  336- 378-7881   °[facility] [address], [place] 1-800-853-5163 or 336-641-4981   ° °Mobile Crisis Teams °Organization         Address  Phone  Notes  °[facility], Mobile Crisis Care Unit  1-877-626-1772   °Assertive °[facility] ° [address]  [place], [place] 336-834-9664   °Sharon DeEsch [address] °[place] 336-554-5454   ° °Self-Help/Support Groups °Organization         Address  Phone             Notes  °[facility] [place] - variety of support groups  336- 373-1402 Call for more information  °Narcotics Anonymous (NA), [facility] [address], °[place]  2 meetings at this location  ° °  Residential Treatment Programs Organization         Address  Phone  Notes  [facility] [address]    [place]  1-(640)306-3152   [facility]  [address] [place] [address], [place], [place]   [place] Arbuckle, [place] 862-369-8999 Admissions: 8am-3pm M-F  [facility] [place] [address],    [place], [place] 267-124-5809   [facility] [address] [place], [place], [place]   [facility] [address]  [place], [place]   [facility] - Intensive Outpatient [address], [place], [place]   [facility] [place]  [place], [place] 1-(365) 747-6168 or 818-319-2302   [facility] [address] [place], [place] Accepts Medicaid  [facility] [address]  [place] 1-(343) 340-7113 Substance Abuse/Addiction Treatment   [facility]         Address  Phone  Notes  [facility]  620-762-3842   Domenic Schwab, PhD [address] Arlis Porta [place], [place]   949-784-7699 or 725-842-1127   [facility] [place], [place] 934-535-2502   [facility] [address], [place], [place] [phone] Insurance/Medicaid/sponsorship through [facility] [address] [place]                                    [place], [place] [phone] [place] [address], [place] [phone]    Dr. Adele Schilder  (204)[phone]   [facility] Dept. 1) [address], [place] 2) [place] 3)  [place] [address], [place] [phone]  [phone]   [place] 306-045-8763 or [phone] (After Hours)     Take your usual prescriptions as previously directed. Wear the cervical collar at all times.  Call your regular medical doctor tomorrow to schedule a follow up appointment within the next 2 days. Call the Neurosurgeon tomorrow to schedule a follow up appointment within the next 2 weeks.  Return to the Emergency Department immediately sooner if worsening.

## 2015-06-15 ENCOUNTER — Other Ambulatory Visit: Payer: Self-pay | Admitting: Neurosurgery

## 2015-06-15 DIAGNOSIS — S12110S Anterior displaced Type II dens fracture, sequela: Secondary | ICD-10-CM

## 2015-06-22 ENCOUNTER — Other Ambulatory Visit: Payer: Medicare Other

## 2015-06-25 ENCOUNTER — Ambulatory Visit
Admission: RE | Admit: 2015-06-25 | Discharge: 2015-06-25 | Disposition: A | Discharge status: Home / Self Care | Payer: Medicare Other | Source: Ambulatory Visit | Attending: Neurosurgery | Admitting: Neurosurgery

## 2015-06-25 DIAGNOSIS — S12110S Anterior displaced Type II dens fracture, sequela: Secondary | ICD-10-CM

## 2015-08-13 ENCOUNTER — Encounter (HOSPITAL_COMMUNITY): Payer: Self-pay | Admitting: Emergency Medicine

## 2015-08-13 ENCOUNTER — Emergency Department (HOSPITAL_COMMUNITY): Payer: Medicare Other

## 2015-08-13 ENCOUNTER — Emergency Department (HOSPITAL_COMMUNITY)
Admission: EM | Admit: 2015-08-13 | Discharge: 2015-08-14 | Disposition: A | Discharge status: Home / Self Care | Payer: Medicare Other | Attending: Emergency Medicine | Admitting: Emergency Medicine

## 2015-08-13 DIAGNOSIS — H9190 Unspecified hearing loss, unspecified ear: Secondary | ICD-10-CM | POA: Diagnosis not present

## 2015-08-13 DIAGNOSIS — I509 Heart failure, unspecified: Secondary | ICD-10-CM | POA: Diagnosis not present

## 2015-08-13 DIAGNOSIS — I10 Essential (primary) hypertension: Secondary | ICD-10-CM | POA: Diagnosis not present

## 2015-08-13 DIAGNOSIS — Y92091 Bathroom in other non-institutional residence as the place of occurrence of the external cause: Secondary | ICD-10-CM | POA: Diagnosis not present

## 2015-08-13 DIAGNOSIS — R011 Cardiac murmur, unspecified: Secondary | ICD-10-CM | POA: Insufficient documentation

## 2015-08-13 DIAGNOSIS — S4991XA Unspecified injury of right shoulder and upper arm, initial encounter: Secondary | ICD-10-CM | POA: Diagnosis not present

## 2015-08-13 DIAGNOSIS — Z79899 Other long term (current) drug therapy: Secondary | ICD-10-CM | POA: Insufficient documentation

## 2015-08-13 DIAGNOSIS — G309 Alzheimer's disease, unspecified: Secondary | ICD-10-CM | POA: Insufficient documentation

## 2015-08-13 DIAGNOSIS — I252 Old myocardial infarction: Secondary | ICD-10-CM | POA: Insufficient documentation

## 2015-08-13 DIAGNOSIS — Y998 Other external cause status: Secondary | ICD-10-CM | POA: Insufficient documentation

## 2015-08-13 DIAGNOSIS — W1812XA Fall from or off toilet with subsequent striking against object, initial encounter: Secondary | ICD-10-CM | POA: Diagnosis not present

## 2015-08-13 DIAGNOSIS — Z8739 Personal history of other diseases of the musculoskeletal system and connective tissue: Secondary | ICD-10-CM | POA: Insufficient documentation

## 2015-08-13 DIAGNOSIS — F419 Anxiety disorder, unspecified: Secondary | ICD-10-CM | POA: Diagnosis not present

## 2015-08-13 DIAGNOSIS — Z043 Encounter for examination and observation following other accident: Secondary | ICD-10-CM | POA: Diagnosis present

## 2015-08-13 DIAGNOSIS — W19XXXA Unspecified fall, initial encounter: Secondary | ICD-10-CM

## 2015-08-13 DIAGNOSIS — S51001A Unspecified open wound of right elbow, initial encounter: Secondary | ICD-10-CM | POA: Diagnosis not present

## 2015-08-13 DIAGNOSIS — F0280 Dementia in other diseases classified elsewhere without behavioral disturbance: Secondary | ICD-10-CM | POA: Insufficient documentation

## 2015-08-13 DIAGNOSIS — Y9389 Activity, other specified: Secondary | ICD-10-CM | POA: Diagnosis not present

## 2015-08-13 DIAGNOSIS — S2232XA Fracture of one rib, left side, initial encounter for closed fracture: Secondary | ICD-10-CM

## 2015-08-13 DIAGNOSIS — S0083XA Contusion of other part of head, initial encounter: Secondary | ICD-10-CM

## 2015-08-13 DIAGNOSIS — S0181XA Laceration without foreign body of other part of head, initial encounter: Secondary | ICD-10-CM

## 2015-08-13 DIAGNOSIS — Z8639 Personal history of other endocrine, nutritional and metabolic disease: Secondary | ICD-10-CM | POA: Diagnosis not present

## 2015-08-13 DIAGNOSIS — S51011A Laceration without foreign body of right elbow, initial encounter: Secondary | ICD-10-CM

## 2015-08-13 DIAGNOSIS — I2510 Atherosclerotic heart disease of native coronary artery without angina pectoris: Secondary | ICD-10-CM | POA: Diagnosis not present

## 2015-08-13 DIAGNOSIS — Y92009 Unspecified place in unspecified non-institutional (private) residence as the place of occurrence of the external cause: Secondary | ICD-10-CM

## 2015-08-13 MED ORDER — LIDOCAINE HCL (PF) 1 % IJ SOLN
INTRAMUSCULAR | Status: AC
  Filled 2015-08-13: qty 5

## 2015-08-13 MED ORDER — LIDOCAINE-EPINEPHRINE (PF) 2 %-1:200000 IJ SOLN
10.0000 mL | Freq: Once | INTRAMUSCULAR | Status: AC
  Administered 2015-08-14: 10 mL
  Filled 2015-08-13: qty 20

## 2015-08-13 NOTE — ED Notes (Signed)
Pt fell off bedside toilet at home. She has prior neck fracture from previous fall, but tonight c/o laceration to the rt side of head and right elbow.

## 2015-08-13 NOTE — ED Provider Notes (Addendum)
CSN: [id number]     Arrival date & time 08/13/15  2143 History  By signing my name below, I, Barbara Snyder, attest that this documentation has been prepared under the direction and in the presence of Barbara Porter, MD. Electronically Signed: Helane Snyder, ED Scribe. 08/14/2015. 12:25 AM.    Chief Complaint  Patient presents with  . Fall   The history is provided by the patient and a relative. No language interpreter was used.   Level 5 caveat for dementia  HPI Comments: Barbara Snyder is a 81 y.o. female with a PMHx of AAA, CAD, MI, CHF, HTN, and Alzheimer's disease brought in by ambulance, who presents to the Emergency Department complaining of a fall that occurred just PTA. Per son, pt slipped off of the toilet seat on a portable toilet and fell to the floor, striking her head against the heater vent. He notes pt was bleeding so profusely that he had to call EMS. He denies pt having LOC. Pt reports associated pain to the right side of her head, neck pain, as well as a laceration and bruising to the right forehead, and bruising to the lower lip and chin. Per son, pt also has abrasions to her elbow where she struck the floor. He notes pt has also been eating less than usual and has lost 25 pounds in the past 6 months. He reports pt having a wound on her left shin that has been healing well. He also states pt has very thin skin. Per son, pt has a neck fracture from a previous fall in August, for which she was wearing a neck brace. He notes that after the fall he replaced the brace due to the bloody stains, then reapplied it before EMS transported her. He notes pt's neurosurgeon (Dr Barbara Snyder) advised pt to continue wearing the brace forever since last seeing her in October because the fracture wasn't healing and wouldn't heal. . He states pt lives with him since the neck fracture and uses a walker to ambulate.   PCP Dr Willey Blade  Neurosurgery Dr Barbara Snyder  Past Medical History  Diagnosis Date  .  Alzheimer disease   . Hypertension   . CHF (congestive heart failure) ([place])   . Arthritis   . Thyroid disease   . Myocardial infarct ([place]) 2001  . S/P colonoscopy Apr 2004    left-sided diverticula, internal hemorrhoids ([facility])  . Hip pain   . Skin rash   . Cholesterol blood decreased   . Trouble in sleeping   . Anxiety   . Coronary artery disease   . Abdominal aortic aneurysm [facility])    Past Surgical History  Procedure Laterality Date  . Mastoidectomy    . Colonoscopy  12/20/02    internal hemorrhoids otherwise normal/left-side diverticula  . Colonoscopy  10/16/2011    Procedure: COLONOSCOPY;  Surgeon: Daneil Dolin, MD;  Location: [facility];  Service: Endoscopy;  Laterality: N/A;  10:00- pts family request this time    Family History  Problem Relation Age of Onset  . Colon cancer Sister     Stage II, in remission  . Colon cancer      nephew; Stage IV   Social History  Substance Use Topics  . Smoking status: Never Smoker   . Smokeless tobacco: Never Used  . Alcohol Use: No   Uses wheelchair, and walker She lives with her son  OB History    Gravida Para Term Preterm AB TAB SAB Ectopic Multiple Living  2 2 2       2      Review of Systems  Unable to perform ROS: Dementia    Allergies  Review of patient's allergies indicates no known allergies.  Home Medications   Prior to Admission medications   Medication Sig Start Date End Date Taking? Authorizing Provider  acetaminophen (TYLENOL) 325 MG tablet Take 325 mg by mouth every 6 (six) hours as needed. For pain   Yes Historical Provider, MD  ENSURE (ENSURE) Take 237 mLs by mouth daily.   Yes Historical Provider, MD  furosemide (LASIX) 40 MG tablet Take 20 mg by mouth daily.    Yes Historical Provider, MD  metoprolol tartrate (LOPRESSOR) 25 MG tablet Take 12.5 mg by mouth 2 (two) times daily.     Yes Historical Provider, MD  Multiple Vitamins-Minerals (MULTIVITAMIN WITH MINERALS) tablet Take 1 tablet by mouth  daily.     Yes Historical Provider, MD  sertraline (ZOLOFT) 50 MG tablet Take 50 mg by mouth daily. 04/14/13  Yes Historical Provider, MD  spironolactone (ALDACTONE) 25 MG tablet Take 25 mg by mouth daily.    Yes Historical Provider, MD   BP 156/91 mmHg  Pulse 102  Temp(Src) 97.7 F (36.5 C) (Oral)  Resp 20  Ht 5' (1.524 m)  Wt 95 lb (43.092 kg)  BMI 18.55 kg/m2  SpO2 94%  Vital signs normal except tachycardia, hypertension  Physical Exam  Constitutional:  Non-toxic appearance. She does not appear ill. No distress.  Frail, elderly female who is very hard of hearing  HENT:  Right Ear: External ear normal.  Left Ear: External ear normal.  Nose: Nose normal. No mucosal edema or rhinorrhea.  Mouth/Throat: Oropharynx is clear and moist and mucous membranes are normal. No dental abscesses or uvula swelling.  1.5 cm laceration to the R forehead into the subcutaneous tissue with active bleeding. Large hematoma of the R forehead starting to involve the R upper eyelid. Contusion to the R lateral lower lip/chin area.  Eyes: Conjunctivae and EOM are normal. Pupils are equal, round, and reactive to light.  Neck: Full passive range of motion without pain.  Pt is already wearing an aspen collar  Cardiovascular: Normal rate and regular rhythm.  Exam reveals no gallop and no friction rub.   Murmur heard. Pulmonary/Chest: Effort normal and breath sounds normal. No respiratory distress. She has no wheezes. She has no rhonchi. She has no rales. She exhibits no tenderness and no crepitus.  Abdominal: Soft. Normal appearance and bowel sounds are normal. She exhibits no distension. There is no tenderness. There is no rebound and no guarding.  Musculoskeletal: Normal range of motion. She exhibits tenderness. She exhibits no edema.  Moves all extremities well. Skin tear to the R elbow, TTP over R shoulder  Neurological: She is alert. She has normal strength. No cranial nerve deficit.  Skin: Skin is warm,  dry and intact. No rash noted. No erythema. No pallor.  Psychiatric: She has a normal mood and affect. Her speech is normal and behavior is normal. Her mood appears not anxious.  Nursing note and vitals reviewed.            ED Course  Procedures   Medications  acetaminophen (TYLENOL) tablet 650 mg (not administered)  lidocaine-EPINEPHrine (XYLOCAINE W/EPI) 2 %-1:200000 (PF) injection 10 mL (10 mLs Infiltration Given by Other 08/14/15 0034)    DIAGNOSTIC STUDIES: Oxygen Saturation is 94% on RA, normal by my interpretation.    COORDINATION OF CARE: 11:21  PM - Discussed plans to order a CAT scan of the head and neck, as well as to numb, cleanse, and suture the head laceration. Pt advised of plan for treatment and pt agrees.  12:10 AM - Pt c/o pain and was found to have TTP around the left shoulder area when I attempt to do ROM of her LUE. Discussed plans to order diagnostic imaging. Pt advised of plan for treatment and pt and her son agrees.  01:25 Recheck after her shoulder xrays were read showing possible rib fractures. Rib xrays were ordered. We discussed rib belts but he doesn't want to use it because she won't be able to get it on and off by herself. Son states he has acetaminophen at home to give her for pain.   Son was given her xray results.    LACERATION REPAIR PROCEDURE NOTE The patient's identification was confirmed and consent was obtained. This procedure was performed by Barbara Porter, MD at 12:05 AM. Site: Right  forehead Sterile procedures observed Anesthetic used (type and amt): Lidocaine-Epinephrine 2% Injection, 5 mL Suture type/size:1 5-0 viacryl in subcutaneous tissue, 3 5-0 nylon in cutaneous layer Length:1.5 cm # of Sutures: 4 Technique:simple interrupted Complexity complex Antibx ointment applied  Site anesthetized, cleaned with betadyne/saline mix, explored without evidence of foreign body, wound well approximated, site covered with dry,  sterile dressing.  Patient tolerated procedure well without complications. Instructions for care discussed verbally and patient provided with additional written instructions for homecare and f/u.    Results for orders placed or performed during the hospital encounter of 08/13/15  Urinalysis, Routine w reflex microscopic  Result Value Ref Range   Color, Urine YELLOW YELLOW   APPearance CLEAR CLEAR   Specific Gravity, Urine 1.010 1.005 - 1.030   pH 6.0 5.0 - 8.0   Glucose, UA NEGATIVE NEGATIVE mg/dL   Hgb urine dipstick NEGATIVE NEGATIVE   Bilirubin Urine NEGATIVE NEGATIVE   Ketones, ur NEGATIVE NEGATIVE mg/dL   Protein, ur NEGATIVE NEGATIVE mg/dL   Nitrite NEGATIVE NEGATIVE   Leukocytes, UA NEGATIVE NEGATIVE   Laboratory interpretation all normal       Imaging Review Dg Ribs Unilateral W/chest Left  08/14/2015  CLINICAL DATA:  Fall with left rib pain.  Initial encounter. EXAM: LEFT RIBS AND CHEST - 3+ VIEW COMPARISON:  04/15/2015 FINDINGS: Limited study due to patient scoliosis and kyphosis, with essentially single view imaging of the left ribs. There is lateral left third, fourth, and fifth rib fractures which are age-indeterminate (on one image these appear acute and unhealed but on another there is suggestion of callus formation). There is up to mild displacement. T8, T10, T11, and T12 compression fractures which are likely chronic based on comparison chest x-ray. There is thoracic dextroscoliosis. Chronic cardiomegaly and aortic tortuosity. IMPRESSION: 1. Age-indeterminate left third, fourth, and fifth rib fractures. 2. No pleural effusion or pneumothorax. 3. Multilevel thoracic compression fracture, chronic appearing. Electronically Signed   By: Monte Fantasia M.D.   On: 08/14/2015 02:31   Ct Head Wo Contrast Ct Maxillofacial Wo Cm Ct Cervical Spine Wo Contrast  08/14/2015  CLINICAL DATA:  81 year old female with fall and trauma to the head and face. EXAM: CT HEAD WITHOUT  CONTRAST CT MAXILLOFACIAL WITHOUT CONTRAST CT CERVICAL SPINE WITHOUT CONTRAST TECHNIQUE: Multidetector CT imaging of the head, cervical spine, and maxillofacial structures were performed using the standard protocol without intravenous contrast. Multiplanar CT image reconstructions of the cervical spine and maxillofacial structures were also generated. COMPARISON:  CT dated  06/25/2015 FINDINGS: CT HEAD FINDINGS The ventricles are dilated and the sulci are prominent compatible with age-related atrophy. Periventricular and deep white matter hypodensities represent chronic microvascular ischemic changes. There is no intracranial hemorrhage. A 4 mm high attenuating focus along the left tentorium likely represents a focal area of partial calcification. No mass effect or midline shift identified. The visualized paranasal sinuses and mastoid air cells are well aerated. There is postsurgical changes of partial left mastoidectomy. The calvarium is intact. Right forehead laceration and hematoma. CT MAXILLOFACIAL FINDINGS There is no acute fracture. The bones are osteopenic. The maxilla, mandible, and pterygoid plates are intact. The globes, retro-orbital fat, and orbital wall are preserved. The paranasal sinuses and mastoid air cells are well aerated. The soft tissues are unremarkable. CT CERVICAL SPINE FINDINGS Evaluation for fracture is limited due to advanced osteopenia and severe degenerative changes. There is no definite acute fracture or subluxation of the cervical spine.There is osteopenia with multilevel degenerative changes of the cervical spine. There is colonic appearing fractures of the posterior ring of the C1. Fracture of the base of the dens with approximately 3 mm posterior displacement of the tip of the dens in relation to the base similar to prior study. Stable grade 1 C4-C5 anterolisthesis Partially visualized calcific density in the left upper lobe likely corresponds to the atherosclerotic calcification of  the aortic arch. IMPRESSION: No acute intracranial hemorrhage. No acute/ traumatic cervical spine or facial bone fractures. Severe osteopenia with advanced degenerative changes of the cervical spine. Chronic fracture of the base of the dens. Electronically Signed   By: Anner Crete M.D.   On: 08/14/2015 00:16   Dg Shoulder Left  08/14/2015  CLINICAL DATA:  LEFT shoulder pain after fall at home tonight. History of C2 fracture. EXAM: LEFT SHOULDER - 2+ VIEW COMPARISON:  LEFT shoulder radiograph April 22, 2006 FINDINGS: No acute fracture deformity or dislocation. Patient is osteopenic, limiting sensitivity. Moderate degenerative change of the shoulder, progressed from prior imaging. Multiple age indeterminate LEFT rib fractures. Soft tissue planes are nonsuspicious. Tortuous calcified aorta. IMPRESSION: Multiple age indeterminate LEFT rib fractures for which dedicated chest radiograph is recommended. No acute shoulder fracture deformity or dislocation. Osteopenia, decreasing sensitivity for acute nondisplaced fractures. Electronically Signed   By: Elon Alas M.D.   On: 08/14/2015 01:23    I have personally reviewed and evaluated these images and lab results as part of my medical decision-making.    MDM   Final diagnoses:  Fall at home, initial encounter  Laceration of forehead, initial encounter  Contusion of face, initial encounter  Left rib fracture, closed, initial encounter  Skin tear of elbow without complication, right, initial encounter   Plan discharge  Barbara Porter, MD, FACEP   I personally performed the services described in this documentation, which was scribed in my presence. The recorded information has been reviewed and considered.  Barbara Porter, MD, Barbette Or, MD 08/14/15 [id number]  Barbara Porter, MD 08/14/15 [phone]

## 2015-08-14 ENCOUNTER — Emergency Department (HOSPITAL_COMMUNITY): Payer: Medicare Other

## 2015-08-14 LAB — URINALYSIS, ROUTINE W REFLEX MICROSCOPIC
Bilirubin Urine: NEGATIVE
Glucose, UA: NEGATIVE mg/dL
Hgb urine dipstick: NEGATIVE
KETONES UR: NEGATIVE mg/dL
LEUKOCYTES UA: NEGATIVE
NITRITE: NEGATIVE
PROTEIN: NEGATIVE mg/dL
Specific Gravity, Urine: 1.01 (ref 1.005–1.030)
pH: 6 (ref 5.0–8.0)

## 2015-08-14 MED ORDER — ACETAMINOPHEN 325 MG PO TABS
650.0000 mg | ORAL_TABLET | Freq: Once | ORAL | Status: AC
  Administered 2015-08-14: 650 mg via ORAL
  Filled 2015-08-14: qty 2

## 2015-08-14 NOTE — Discharge Instructions (Signed)
Ice packs to the bruised areas. The sutures need to be removed in about 5 days. Return to the ED for any problems listed on the head injury sheet, or if her wounds get infected (increased redness, drain pus, fever), or if she gets short of breath, cough, fever. She can have acetaminophen 650 mg 3-4 times a day for pain as needed. She of course, needs to continue wearing the neck brace.    Contusion A contusion is a deep bruise. Contusions happen when an injury causes bleeding under the skin. Symptoms of bruising include pain, swelling, and discolored skin. The skin may turn blue, purple, or yellow. HOME CARE   Rest the injured area.  If told, put ice on the injured area.  Put ice in a plastic bag.  Place a towel between your skin and the bag.  Leave the ice on for 20 minutes, 2-3 times per day.  If told, put light pressure (compression) on the injured area using an elastic bandage. Make sure the bandage is not too tight. Remove it and put it back on as told by your doctor.  If possible, raise (elevate) the injured area above the level of your heart while you are sitting or lying down.  Take over-the-counter and prescription medicines only as told by your doctor. GET HELP IF:  Your symptoms do not get better after several days of treatment.  Your symptoms get worse.  You have trouble moving the injured area. GET HELP RIGHT AWAY IF:   You have very bad pain.  You have a loss of feeling (numbness) in a hand or foot.  Your hand or foot turns pale or cold.   This information is not intended to replace advice given to you by your health care provider. Make sure you discuss any questions you have with your health care provider.   Document Released: 01/28/2008 Document Revised: 05/02/2015 Document Reviewed: 12/27/2014 Elsevier Interactive Patient Education 2016 [place].  Facial or Scalp Contusion  A facial or scalp contusion is a deep bruise on the face or head. Contusions  happen when an injury causes bleeding under the skin. Signs of bruising include pain, puffiness (swelling), and discolored skin. The contusion may turn blue, purple, or yellow. HOME CARE  Only take medicines as told by your doctor.  Put ice on the injured area.  Put ice in a plastic bag.  Place a towel between your skin and the bag.  Leave the ice on for 20 minutes, 2-3 times a day. GET HELP IF:  You have bite problems.  You have pain when chewing.  You are worried about your face not healing normally. GET HELP RIGHT AWAY IF:   You have severe pain or a headache and medicine does not help.  You are very tired or confused, or your personality changes.  You throw up (vomit).  You have a nosebleed that will not stop.  You see two of everything (double vision) or have blurry vision.  You have fluid coming from your nose or ear.  You have problems walking or using your arms or legs. MAKE SURE YOU:   Understand these instructions.  Will watch your condition.  Will get help right away if you are not doing well or get worse.   This information is not intended to replace advice given to you by your health care provider. Make sure you discuss any questions you have with your health care provider.   Document Released: 07/31/2011 Document Revised: 09/01/2014 Document  Reviewed: 03/24/2013 Elsevier Interactive Patient Education 2016 [place] Injury, Adult You have a head injury. Headaches and throwing up (vomiting) are common after a head injury. It should be easy to wake up from sleeping. Sometimes you must stay in the hospital. Most problems happen within the first 24 hours. Side effects may occur up to 7-10 days after the injury.  WHAT ARE THE TYPES OF HEAD INJURIES? Head injuries can be as minor as a bump. Some head injuries can be more severe. More severe head injuries include:  A jarring injury to the brain (concussion).  A bruise of the brain (contusion).  This mean there is bleeding in the brain that can cause swelling.  A cracked skull (skull fracture).  Bleeding in the brain that collects, clots, and forms a bump (hematoma). WHEN SHOULD I GET HELP RIGHT AWAY?   You are confused or sleepy.  You cannot be woken up.  You feel sick to your stomach (nauseous) or keep throwing up (vomiting).  Your dizziness or unsteadiness is getting worse.  You have very bad, lasting headaches that are not helped by medicine. Take medicines only as told by your doctor.  You cannot use your arms or legs like normal.  You cannot walk.  You notice changes in the black spots in the center of the colored part of your eye (pupil).  You have clear or bloody fluid coming from your nose or ears.  You have trouble seeing. During the next 24 hours after the injury, you must stay with someone who can watch you. This person should get help right away (call 911 in the [place] if you start to shake and are not able to control it (have seizures), you pass out, or you are unable to wake up. HOW CAN I PREVENT A HEAD INJURY IN THE FUTURE?  Wear seat belts.  Wear a helmet while bike riding and playing sports like football.  Stay away from dangerous activities around the house. WHEN CAN I RETURN TO NORMAL ACTIVITIES AND ATHLETICS? See your doctor before doing these activities. You should not do normal activities or play contact sports until 1 week after the following symptoms have stopped:  Headache that does not go away.  Dizziness.  Poor attention.  Confusion.  Memory problems.  Sickness to your stomach or throwing up.  Tiredness.  Fussiness.  Bothered by bright lights or loud noises.  Anxiousness or depression.  Restless sleep. MAKE SURE YOU:   Understand these instructions.  Will watch your condition.  Will get help right away if you are not doing well or get worse.   This information is not intended to replace advice given to you by your  health care provider. Make sure you discuss any questions you have with your health care provider.   Document Released: 07/24/2008 Document Revised: 09/01/2014 Document Reviewed: 04/18/2013 Elsevier Interactive Patient Education 2016 [place].  Rib Fracture A rib fracture is a break or crack in one of the bones of the ribs. The ribs are like a cage that goes around your upper chest. A broken or cracked rib is often painful, but most do not cause other problems. Most rib fractures heal on their own in 1-3 months. HOME CARE  Avoid activities that cause pain to the injured area. Protect your injured area.  Slowly increase activity as told by your doctor.  Take medicine as told by your doctor.  Put ice on the injured area for the first 1-2 days after  you have been treated or as told by your doctor.  Put ice in a plastic bag.  Place a towel between your skin and the bag.  Leave the ice on for 15-20 minutes at a time, every 2 hours while you are awake.  Do deep breathing as told by your doctor. You may be told to:  Take deep breaths many times a day.  Cough many times a day while hugging a pillow.  Use a device (incentive spirometer) to perform deep breathing many times a day.  Drink enough fluids to keep your pee (urine) clear or pale yellow.   Do not wear a rib belt or binder. These do not allow you to breathe deeply. GET HELP RIGHT AWAY IF:   You have a fever.  You have trouble breathing.   You cannot stop coughing.  You cough up thick or bloody spit (mucus).   You feel sick to your stomach (nauseous), throw up (vomit), or have belly (abdominal) pain.   Your pain gets worse and medicine does not help.  MAKE SURE YOU:   Understand these instructions.  Will watch your condition.  Will get help right away if you are not doing well or get worse.   This information is not intended to replace advice given to you by your health care provider. Make sure you  discuss any questions you have with your health care provider.   Document Released: 05/20/2008 Document Revised: 12/06/2012 Document Reviewed: 10/13/2012 Elsevier Interactive Patient Education 2016 [employer]  Skin Tear Care A skin tear is when the top layer of skin peels off. To repair the skin, your doctor may use:   Tape.  Skin adhesive strips. HOME CARE  Change bandages (dressings) once a day or as told by your doctor.  Gently clean the area with salt (saline) solution or with a mild soap and water.  Do not rub the injured skin dry. Let the area air dry.  Put petroleum jelly or antibiotic cream on the tear. Do not allow a scab to form.  If the bandage sticks, moisten it with warm soapy water and remove it.  Protect the injured skin until it has healed.  Only take medicine as told by your doctor.  Take showers or baths using warm soapy water. Apply a new bandage after the shower or bath.  Keep all doctor visits as told. GET HELP RIGHT AWAY IF:   You have redness, puffiness (swelling), or more pain in the tear.  You have ayellowish-white fluid (pus) coming from the tear.  You have chills.  You have a red streak that goes away from the tear.  You have a bad smell coming from the tear or bandage.  You have a fever or lasting symptoms for more than 2-3 days.  You have a fever and your symptoms suddenly get worse. MAKE SURE YOU:   Understand these instructions.  Will watch this condition.  Will get help right away if you are not doing well or get worse.   This information is not intended to replace advice given to you by your health care provider. Make sure you discuss any questions you have with your health care provider.   Document Released: 05/20/2008 Document Revised: 05/05/2012 Document Reviewed: 02/23/2012 Elsevier Interactive Patient Education 2016 [place] Sutures are stitches that can be used to close wounds. Taking care  of your wound properly can help prevent pain and infection. It can also help your wound to  heal more quickly. HOW TO CARE FOR YOUR SUTURED WOUND Wound Care  Keep the wound clean and dry.  If you were given a bandage (dressing), change it at least one time per day or as told by your doctor. You should also change it if it gets wet or dirty.  Keep the wound completely dry for the first 24 hours or as told by your doctor. After that time, you may shower or bathe. However, make sure that the wound is not soaked in water until the sutures have been removed.  Clean the wound one time each day or as told by your doctor.  Wash the wound with soap and water.  Rinse the wound with water to remove all soap.  Pat the wound dry with a clean towel. Do not rub the wound.  After cleaning the wound, put a thin layer of antibiotic ointment on it as told your doctor. This ointment:  Helps to prevent infection.  Keeps the bandage from sticking to the wound.  Have the sutures removed as told by your doctor. General Instructions  Take or apply medicines only as told by your doctor.  To help prevent scarring, make sure to cover your wound with sunscreen whenever you are outside after the sutures are removed and the wound is healed. Make sure to wear a sunscreen of at least 30 SPF.  If you were prescribed an antibiotic medicine or ointment, finish all of it even if you start to feel better.  Do not scratch or pick at the wound.  Keep all follow-up visits as told by your doctor. This is important.  Check your wound every day for signs of infection. Watch for:  Redness, swelling, or pain.  Fluid, blood, or pus.  Raise (elevate) the injured area above the level of your heart while you are sitting or lying down, if possible.  Avoid stretching your wound.  Drink enough fluids to keep your pee (urine) clear or pale yellow. GET HELP IF:  You were given a tetanus shot and you have any of these  where the needle went in:  Swelling.  Very bad pain.  Redness.  Bleeding.  You have a fever.  A wound that was closed breaks open.  You notice a bad smell coming from the wound.  You notice something coming out of the wound, such as wood or glass.  Medicine does not help your pain.  You have any of these at the site of the wound.  More redness.  More swelling.  More pain.  You have any of these coming from the wound.  Fluid.  Blood.  Pus.  You notice a change in the color of your skin near the wound.  You need to change the bandage often due to fluid, blood, or pus coming from the wound.  You have a new rash.  You have numbness around the wound. GET HELP RIGHT AWAY IF:  You have very bad swelling around the wound.  Your pain suddenly gets worse and is very bad.  You have painful lumps near the wound or on skin that is anywhere on your body.  You have a red streak going away from the wound.  The wound is on your hand or foot and you cannot move a finger or toe like normal.  The wound is on your hand or foot and you notice that your fingers or toes look pale or bluish.   This information is not intended to replace advice given  to you by your health care provider. Make sure you discuss any questions you have with your health care provider.   Document Released: 01/28/2008 Document Revised: 12/26/2014 Document Reviewed: 03/23/2013 Elsevier Interactive Patient Education [employer].

## 2015-09-13 ENCOUNTER — Emergency Department (HOSPITAL_COMMUNITY)
Admission: EM | Admit: 2015-09-13 | Discharge: 2015-09-13 | Disposition: A | Discharge status: Home / Self Care | Payer: Medicare Other | Attending: Emergency Medicine | Admitting: Emergency Medicine

## 2015-09-13 ENCOUNTER — Encounter (HOSPITAL_COMMUNITY): Payer: Self-pay | Admitting: Emergency Medicine

## 2015-09-13 DIAGNOSIS — Z79899 Other long term (current) drug therapy: Secondary | ICD-10-CM | POA: Insufficient documentation

## 2015-09-13 DIAGNOSIS — Z8719 Personal history of other diseases of the digestive system: Secondary | ICD-10-CM | POA: Diagnosis not present

## 2015-09-13 DIAGNOSIS — Z8639 Personal history of other endocrine, nutritional and metabolic disease: Secondary | ICD-10-CM | POA: Insufficient documentation

## 2015-09-13 DIAGNOSIS — G309 Alzheimer's disease, unspecified: Secondary | ICD-10-CM | POA: Diagnosis not present

## 2015-09-13 DIAGNOSIS — Y998 Other external cause status: Secondary | ICD-10-CM | POA: Insufficient documentation

## 2015-09-13 DIAGNOSIS — S51811A Laceration without foreign body of right forearm, initial encounter: Secondary | ICD-10-CM | POA: Insufficient documentation

## 2015-09-13 DIAGNOSIS — W06XXXA Fall from bed, initial encounter: Secondary | ICD-10-CM | POA: Diagnosis not present

## 2015-09-13 DIAGNOSIS — Y9389 Activity, other specified: Secondary | ICD-10-CM | POA: Diagnosis not present

## 2015-09-13 DIAGNOSIS — Z8781 Personal history of (healed) traumatic fracture: Secondary | ICD-10-CM | POA: Diagnosis not present

## 2015-09-13 DIAGNOSIS — Y92009 Unspecified place in unspecified non-institutional (private) residence as the place of occurrence of the external cause: Secondary | ICD-10-CM | POA: Diagnosis not present

## 2015-09-13 DIAGNOSIS — I2510 Atherosclerotic heart disease of native coronary artery without angina pectoris: Secondary | ICD-10-CM | POA: Insufficient documentation

## 2015-09-13 DIAGNOSIS — I509 Heart failure, unspecified: Secondary | ICD-10-CM | POA: Diagnosis not present

## 2015-09-13 DIAGNOSIS — F0280 Dementia in other diseases classified elsewhere without behavioral disturbance: Secondary | ICD-10-CM | POA: Diagnosis not present

## 2015-09-13 DIAGNOSIS — I10 Essential (primary) hypertension: Secondary | ICD-10-CM | POA: Diagnosis not present

## 2015-09-13 DIAGNOSIS — F419 Anxiety disorder, unspecified: Secondary | ICD-10-CM | POA: Insufficient documentation

## 2015-09-13 DIAGNOSIS — M1990 Unspecified osteoarthritis, unspecified site: Secondary | ICD-10-CM | POA: Diagnosis not present

## 2015-09-13 DIAGNOSIS — I252 Old myocardial infarction: Secondary | ICD-10-CM | POA: Insufficient documentation

## 2015-09-13 DIAGNOSIS — S51011A Laceration without foreign body of right elbow, initial encounter: Secondary | ICD-10-CM | POA: Diagnosis not present

## 2015-09-13 DIAGNOSIS — T148 Other injury of unspecified body region: Secondary | ICD-10-CM | POA: Insufficient documentation

## 2015-09-13 DIAGNOSIS — S59911A Unspecified injury of right forearm, initial encounter: Secondary | ICD-10-CM | POA: Diagnosis present

## 2015-09-13 DIAGNOSIS — T148XXA Other injury of unspecified body region, initial encounter: Secondary | ICD-10-CM

## 2015-09-13 NOTE — Discharge Instructions (Signed)
Use Vaseline gauze over skin teats.  Try to avoid placing any adhesive (tape, band-aid) directly on to her skin as this may tear when removed.

## 2015-09-13 NOTE — ED Provider Notes (Signed)
CSN: [id number]     Arrival date & time 09/13/15  1304 History   First MD Initiated Contact with Patient 09/13/15 1314     Chief Complaint  Patient presents with  . Fall      HPI  Patient presents for evaluation of skin tears on her arm.  Patient has history of dementia. Has a C1 fracture that she is wearing an [employer] for. She falls frequently. Son states that she rolled from her bed onto the floor. She has a low bed at home. He has ordered "a medical bed with rails". He states that she seems fine to him her interaction with him is normal. She is not any more confused versus baseline. However she has some skin tears on her right arm. He placed some tape and bandages over them. He wanted to have her evaluated for them.  Past Medical History  Diagnosis Date  . Alzheimer disease   . Hypertension   . CHF (congestive heart failure) ([place])   . Arthritis   . Thyroid disease   . Myocardial infarct ([place]) 2001  . S/P colonoscopy Apr 2004    left-sided diverticula, internal hemorrhoids ([facility])  . Hip pain   . Skin rash   . Cholesterol blood decreased   . Trouble in sleeping   . Anxiety   . Coronary artery disease   . Abdominal aortic aneurysm [facility])    Past Surgical History  Procedure Laterality Date  . Mastoidectomy    . Colonoscopy  12/20/02    internal hemorrhoids otherwise normal/left-side diverticula  . Colonoscopy  10/16/2011    Procedure: COLONOSCOPY;  Surgeon: Daneil Dolin, MD;  Location: [facility];  Service: Endoscopy;  Laterality: N/A;  10:00- pts family request this time    Family History  Problem Relation Age of Onset  . Colon cancer Sister     Stage II, in remission  . Colon cancer      nephew; Stage IV   Social History  Substance Use Topics  . Smoking status: Never Smoker   . Smokeless tobacco: Never Used  . Alcohol Use: No   OB History    Gravida Para Term Preterm AB TAB SAB Ectopic Multiple Living   2 2 2       2      Review of Systems  Unable to  perform ROS: Dementia      Allergies  Review of patient's allergies indicates no known allergies.  Home Medications   Prior to Admission medications   Medication Sig Start Date End Date Taking? Authorizing Provider  ENSURE (ENSURE) Take 237 mLs by mouth daily.   Yes Historical Provider, MD  furosemide (LASIX) 40 MG tablet Take 20 mg by mouth daily.    Yes Historical Provider, MD  metoprolol tartrate (LOPRESSOR) 25 MG tablet Take 12.5 mg by mouth 2 (two) times daily.     Yes Historical Provider, MD  Multiple Vitamins-Minerals (MULTIVITAMIN WITH MINERALS) tablet Take 1 tablet by mouth daily.     Yes Historical Provider, MD  sertraline (ZOLOFT) 50 MG tablet Take 50 mg by mouth daily. 04/14/13  Yes Historical Provider, MD  spironolactone (ALDACTONE) 25 MG tablet Take 25 mg by mouth daily.    Yes Historical Provider, MD  acetaminophen (TYLENOL) 325 MG tablet Take 325 mg by mouth every 6 (six) hours as needed. For pain    Historical Provider, MD   BP 138/58 mmHg  Pulse 79  Temp(Src) 98 F (36.7 C) (Temporal)  Resp  16  SpO2 96% Physical Exam  Constitutional: She appears well-developed and well-nourished. She appears cachectic. No distress.  Thin frail female. Awake and alert. Wearing an aspirin collar. No apparent strike to the head soft tissue swelling or contusion or tenderness to the head or scalp.  HENT:  Head: Normocephalic.  Eyes: Conjunctivae are normal. Pupils are equal, round, and reactive to light. No scleral icterus.  Neck: Normal range of motion. Neck supple. No thyromegaly present.  Cardiovascular: Normal rate and regular rhythm.  Exam reveals no gallop and no friction rub.   No murmur heard. Pulmonary/Chest: Effort normal and breath sounds normal. No respiratory distress. She has no wheezes. She has no rales.  Abdominal: Soft. Bowel sounds are normal. She exhibits no distension. There is no tenderness. There is no rebound.  Musculoskeletal: Normal range of motion.   Neurological: She is alert.  Skin: Skin is warm and dry. No rash noted.  Multiple areas of subcutaneous ecchymosis. Markedly thin skin. Several areas of skin tears only partial thickness on the right forearm, and posterior upper arm at the elbow. Full range of motion of the elbows and wrists.  Psychiatric: She has a normal mood and affect. Her behavior is normal.    ED Course  Procedures (including critical care time) Labs Review Labs Reviewed - No data to display  Imaging Review No results found. I have personally reviewed and evaluated these images and lab results as part of my medical decision-making.   EKG Interpretation None      MDM   Final diagnoses:  Multiple skin tears    No indication for imaging at this time. The patient's son had placed stress in her arms or these were carefully removed to avoid any additional tearing of the skin. Wounds were cleaned. Dressed with nonadherent Vaseline gauze and Kerlix. Patient be discharged back into her son's care.    Tanna Furry, MD 09/13/15 [phone]

## 2015-09-13 NOTE — ED Notes (Signed)
Pt brought in by her son for fall out of bed this morning.  Pt has chronic C1 fx and is in an Aspen collar.  Skin tears to right arm.

## 2015-10-10 ENCOUNTER — Encounter (HOSPITAL_COMMUNITY): Payer: Self-pay | Admitting: Emergency Medicine

## 2015-10-10 ENCOUNTER — Emergency Department (HOSPITAL_COMMUNITY)
Admission: EM | Admit: 2015-10-10 | Discharge: 2015-10-10 | Disposition: A | Discharge status: Home / Self Care | Payer: Medicare Other | Attending: Emergency Medicine | Admitting: Emergency Medicine

## 2015-10-10 ENCOUNTER — Emergency Department (HOSPITAL_COMMUNITY): Payer: Medicare Other

## 2015-10-10 DIAGNOSIS — S199XXA Unspecified injury of neck, initial encounter: Secondary | ICD-10-CM | POA: Diagnosis not present

## 2015-10-10 DIAGNOSIS — Z8639 Personal history of other endocrine, nutritional and metabolic disease: Secondary | ICD-10-CM | POA: Diagnosis not present

## 2015-10-10 DIAGNOSIS — Z79899 Other long term (current) drug therapy: Secondary | ICD-10-CM | POA: Insufficient documentation

## 2015-10-10 DIAGNOSIS — S8012XA Contusion of left lower leg, initial encounter: Secondary | ICD-10-CM | POA: Diagnosis not present

## 2015-10-10 DIAGNOSIS — I509 Heart failure, unspecified: Secondary | ICD-10-CM | POA: Insufficient documentation

## 2015-10-10 DIAGNOSIS — F0280 Dementia in other diseases classified elsewhere without behavioral disturbance: Secondary | ICD-10-CM | POA: Insufficient documentation

## 2015-10-10 DIAGNOSIS — Y998 Other external cause status: Secondary | ICD-10-CM | POA: Diagnosis not present

## 2015-10-10 DIAGNOSIS — S8011XA Contusion of right lower leg, initial encounter: Secondary | ICD-10-CM | POA: Diagnosis not present

## 2015-10-10 DIAGNOSIS — S40021A Contusion of right upper arm, initial encounter: Secondary | ICD-10-CM | POA: Insufficient documentation

## 2015-10-10 DIAGNOSIS — Z8781 Personal history of (healed) traumatic fracture: Secondary | ICD-10-CM | POA: Diagnosis not present

## 2015-10-10 DIAGNOSIS — W1839XA Other fall on same level, initial encounter: Secondary | ICD-10-CM | POA: Diagnosis not present

## 2015-10-10 DIAGNOSIS — S41112A Laceration without foreign body of left upper arm, initial encounter: Secondary | ICD-10-CM | POA: Insufficient documentation

## 2015-10-10 DIAGNOSIS — Y9389 Activity, other specified: Secondary | ICD-10-CM | POA: Insufficient documentation

## 2015-10-10 DIAGNOSIS — G309 Alzheimer's disease, unspecified: Secondary | ICD-10-CM | POA: Diagnosis not present

## 2015-10-10 DIAGNOSIS — S40022A Contusion of left upper arm, initial encounter: Secondary | ICD-10-CM | POA: Insufficient documentation

## 2015-10-10 DIAGNOSIS — S41111A Laceration without foreign body of right upper arm, initial encounter: Secondary | ICD-10-CM | POA: Diagnosis not present

## 2015-10-10 DIAGNOSIS — Y92009 Unspecified place in unspecified non-institutional (private) residence as the place of occurrence of the external cause: Secondary | ICD-10-CM | POA: Insufficient documentation

## 2015-10-10 DIAGNOSIS — S81812A Laceration without foreign body, left lower leg, initial encounter: Secondary | ICD-10-CM | POA: Insufficient documentation

## 2015-10-10 DIAGNOSIS — I252 Old myocardial infarction: Secondary | ICD-10-CM | POA: Diagnosis not present

## 2015-10-10 DIAGNOSIS — T148XXA Other injury of unspecified body region, initial encounter: Secondary | ICD-10-CM

## 2015-10-10 DIAGNOSIS — S8992XA Unspecified injury of left lower leg, initial encounter: Secondary | ICD-10-CM | POA: Diagnosis present

## 2015-10-10 DIAGNOSIS — S81811A Laceration without foreign body, right lower leg, initial encounter: Secondary | ICD-10-CM | POA: Insufficient documentation

## 2015-10-10 DIAGNOSIS — I2510 Atherosclerotic heart disease of native coronary artery without angina pectoris: Secondary | ICD-10-CM | POA: Diagnosis not present

## 2015-10-10 DIAGNOSIS — Z8669 Personal history of other diseases of the nervous system and sense organs: Secondary | ICD-10-CM | POA: Diagnosis not present

## 2015-10-10 DIAGNOSIS — W19XXXA Unspecified fall, initial encounter: Secondary | ICD-10-CM

## 2015-10-10 HISTORY — DX: Repeated falls: R29.6

## 2015-10-10 HISTORY — DX: Unspecified displaced fracture of second cervical vertebra, initial encounter for closed fracture: S12.100A

## 2015-10-10 NOTE — ED Notes (Addendum)
Per family, pt was found in the hallway after falling. Amount of time patient was on the ground is unknown. Pt has a skin tear to LT leg. Pt also placed in c-collar PTA. Pt c/o neck pain. Pt AOX4.

## 2015-10-10 NOTE — ED Notes (Signed)
Son states understanding of care given and follow up instructions.

## 2015-10-10 NOTE — ED Notes (Signed)
Per family, Pt has history of cervical fracture. Pt has been wearing aspen collar since summer.

## 2015-10-10 NOTE — ED Provider Notes (Signed)
CSN: [id number]     Arrival date & time 10/10/15  1903 History   First MD Initiated Contact with Patient 10/10/15 1916     Chief Complaint  Patient presents with  . Fall      Patient is a 82 y.o. female presenting with fall. The history is provided by the patient and a caregiver. The history is limited by the condition of the patient (Hx dementia).  Fall  Pt was seen at 1925. Per pt's family: Pt found in the hallway after falling, possibly to her left side, per family. Pt has significant hx of dementia and frequent falls; does not recall events. Pt has been acting per her baseline, per family. Pt continues to wear Aspen collar since previous fall in 03/2015.    Past Medical History  Diagnosis Date  . Alzheimer disease   . Hypertension   . CHF (congestive heart failure) ([place])   . Arthritis   . Thyroid disease   . Myocardial infarct ([place]) 2001  . S/P colonoscopy Apr 2004    left-sided diverticula, internal hemorrhoids (RMR)  . Hip pain   . Skin rash   . Cholesterol blood decreased   . Trouble in sleeping   . Anxiety   . Coronary artery disease   . Abdominal aortic aneurysm ([place])   . Frequent falls   . Closed C2 fracture ([place]) 03/2015   Past Surgical History  Procedure Laterality Date  . Mastoidectomy    . Colonoscopy  12/20/02    internal hemorrhoids otherwise normal/left-side diverticula  . Colonoscopy  10/16/2011    Procedure: COLONOSCOPY;  Surgeon: Daneil Dolin, MD;  [facility]: [facility];  Service: Endoscopy;  Laterality: N/A;  10:00- pts family request this time    Family History  Problem Relation Age of Onset  . Colon cancer Sister     Stage II, in remission  . Colon cancer      nephew; Stage IV   Social History  Substance Use Topics  . Smoking status: Never Smoker   . Smokeless tobacco: Never Used  . Alcohol Use: No   OB History    Gravida Para Term Preterm AB TAB SAB Ectopic Multiple Living   2 2 2       2      Review of Systems  Unable to perform  ROS: Dementia      Allergies  Review of patient's allergies indicates no known allergies.  Home Medications   Prior to Admission medications   Medication Sig Start Date End Date Taking? Authorizing Provider  acetaminophen (TYLENOL) 325 MG tablet Take 325 mg by mouth every 6 (six) hours as needed. For pain    Historical Provider, MD  ENSURE (ENSURE) Take 237 mLs by mouth daily.    Historical Provider, MD  furosemide (LASIX) 40 MG tablet Take 20 mg by mouth daily.     Historical Provider, MD  metoprolol tartrate (LOPRESSOR) 25 MG tablet Take 12.5 mg by mouth 2 (two) times daily.      Historical Provider, MD  Multiple Vitamins-Minerals (MULTIVITAMIN WITH MINERALS) tablet Take 1 tablet by mouth daily.      Historical Provider, MD  sertraline (ZOLOFT) 50 MG tablet Take 50 mg by mouth daily. 04/14/13   Historical Provider, MD  spironolactone (ALDACTONE) 25 MG tablet Take 25 mg by mouth daily.     Historical Provider, MD   BP 166/75 mmHg  Pulse 91  Temp(Src) 98.5 F (36.9 C) (Oral)  Resp 18  Wt 75  lb (34.02 kg)  SpO2 97% Physical Exam  1930: Physical examination:  Nursing notes reviewed; Vital signs and O2 SAT reviewed;  Constitutional: Well developed, Well nourished, Well hydrated, In no acute distress; Head:  Normocephalic, atraumatic; Eyes: EOMI, PERRL, No scleral icterus; ENMT: Mouth and pharynx normal, Mucous membranes moist; Neck: Trachea midline. Rigid c-collar in place.; Cardiovascular: Regular rate and rhythm, No gallop; Respiratory: Breath sounds clear & equal bilaterally, No wheezes.  Speaking full sentences with ease, Normal respiratory effort/excursion; Chest: Nontender, Movement normal; Abdomen: Soft, Nontender, Nondistended, Normal bowel sounds; Genitourinary: No CVA tenderness; Spine:  No midline TS, LS tenderness. +TTP CS per hx of same "since last summer" per family.;; Extremities: Pulses normal, Pelvis stable. Full range of motion major/large joints of bilat UE's and LE's  without pain or tenderness to palp, Neurovascularly intact. No deformity. No edema, No calf edema or asymmetry. +multiple scattered ecchymosis and superficial skin tears to bilat UE's and LE's.; Neuro: Awake, alert, confused per hx dementia. No facial droop. Speech clear. Moves all extremities spontaneously and to command without apparent gross focal motor deficits..; Skin: Color normal, Warm, Dry.   ED Course  Procedures (including critical care time) Labs Review  Imaging Review  I have personally reviewed and evaluated these images and lab results as part of my medical decision-making.   EKG Interpretation None      MDM  MDM Reviewed: previous chart, nursing note and vitals Interpretation: x-ray and CT scan   Dg Knee Complete 4 Views Left 10/10/2015  CLINICAL DATA:  82 year old female with fall and left knee pain. EXAM: LEFT KNEE - COMPLETE 4+ VIEW COMPARISON:  None. FINDINGS: There is no acute fracture or dislocation. Osteopenia. There is narrowing of the knee compartment compatible with chronic and osteoarthritic changes. No joint effusion. The soft tissues appear unremarkable. IMPRESSION: No acute/ traumatic osseous pathology. Electronically Signed   By: Anner Crete M.D.   On: 10/10/2015 20:29    Dg Elbow Complete Left 10/10/2015  CLINICAL DATA:  82 year old female status post unwitnessed fall with left elbow aspiration EXAM: LEFT ELBOW - COMPLETE 3+ VIEW COMPARISON:  Concurrently obtained radiographs of the left Knee FINDINGS: No acute fracture or malalignment. No evidence of elbow joint effusion. Irregularity of the soft tissues posterior to the proximal forearm consistent with skin tear/ laceration. The bones are mildly osteopenic in appearance. IMPRESSION: No acute fracture or malalignment. Electronically Signed   By: Jacqulynn Cadet M.D.   On: 10/10/2015 20:27   Ct Head Wo Contrast 10/10/2015  CLINICAL DATA:  pt was found in the hallway after falling. Amount of time patient  was on the ground is unknown; neck pain; hx cervical fx EXAM: CT HEAD WITHOUT CONTRAST CT CERVICAL SPINE WITHOUT CONTRAST TECHNIQUE: Multidetector CT imaging of the head and cervical spine was performed following the standard protocol without intravenous contrast. Multiplanar CT image reconstructions of the cervical spine were also generated. COMPARISON:  08/13/2015 FINDINGS: CT HEAD FINDINGS The ventricles are normal configuration. There is ventricular and sulcal enlargement reflecting moderate atrophy, stable from the prior study. There are no parenchymal masses or mass effect. There is no evidence of a cortical infarct. Patchy white matter hypoattenuation is noted bilaterally consistent with moderate chronic microvascular ischemic change, also stable There are no extra-axial masses or abnormal fluid collections. There is no intracranial hemorrhage. No skull fracture.  Sinuses and mastoid air cells are clear. CT CERVICAL SPINE FINDINGS Chronic CT fracture crossing the base of the odontoid, unchanged from prior study. No  new fractures. Moderate to marked loss of disc height throughout the cervical spine. Milder facet degenerative changes. Grade 1 anterolisthesis of C4 on C5. These findings are stable. No soft tissue masses or adenopathy. There are carotid vascular calcifications. Lung apices show mild interstitial thickening and scarring, chronic. IMPRESSION: HEAD CT: No acute intracranial abnormalities. No skull fracture. Moderate atrophy and chronic microvascular ischemic change stable from the prior study. CERVICAL CT: No acute fracture. Chronic C2 fracture and chronic degenerative changes. Electronically Signed   By: Lajean Manes M.D.   On: 10/10/2015 20:29   Ct Cervical Spine Wo Contrast 10/10/2015  CLINICAL DATA:  pt was found in the hallway after falling. Amount of time patient was on the ground is unknown; neck pain; hx cervical fx EXAM: CT HEAD WITHOUT CONTRAST CT CERVICAL SPINE WITHOUT CONTRAST  TECHNIQUE: Multidetector CT imaging of the head and cervical spine was performed following the standard protocol without intravenous contrast. Multiplanar CT image reconstructions of the cervical spine were also generated. COMPARISON:  08/13/2015 FINDINGS: CT HEAD FINDINGS The ventricles are normal configuration. There is ventricular and sulcal enlargement reflecting moderate atrophy, stable from the prior study. There are no parenchymal masses or mass effect. There is no evidence of a cortical infarct. Patchy white matter hypoattenuation is noted bilaterally consistent with moderate chronic microvascular ischemic change, also stable There are no extra-axial masses or abnormal fluid collections. There is no intracranial hemorrhage. No skull fracture.  Sinuses and mastoid air cells are clear. CT CERVICAL SPINE FINDINGS Chronic CT fracture crossing the base of the odontoid, unchanged from prior study. No new fractures. Moderate to marked loss of disc height throughout the cervical spine. Milder facet degenerative changes. Grade 1 anterolisthesis of C4 on C5. These findings are stable. No soft tissue masses or adenopathy. There are carotid vascular calcifications. Lung apices show mild interstitial thickening and scarring, chronic. IMPRESSION: HEAD CT: No acute intracranial abnormalities. No skull fracture. Moderate atrophy and chronic microvascular ischemic change stable from the prior study. CERVICAL CT: No acute fracture. Chronic C2 fracture and chronic degenerative changes. Electronically Signed   By: Lajean Manes M.D.   On: 10/10/2015 20:29    2110:  Multiple scattered superficial skin tears to bilat UE's and LE's; unable to approximate most of the skin tears edges and unclear when some of them have occurred d/t pt's hx of frequent falls (per family); wound care provided. CT/XR reassuring. Family would like to take pt home now. Dx and testing d/w pt's family.  Questions answered.  Verb understanding, agreeable  to d/c home with outpt f/u.    Francine Graven, DO 10/14/15 1328

## 2015-10-10 NOTE — Discharge Instructions (Signed)
°Emergency Department Resource Guide °1) Find a Doctor and Pay Out of Pocket °Although you won't have to find out who is covered by your insurance plan, it is a good idea to ask around and get recommendations. You will then need to call the office and see if the doctor you have chosen will accept you as a new patient and what types of options they offer for patients who are self-pay. Some doctors offer discounts or will set up payment plans for their patients who do not have insurance, but you will need to ask so you aren't surprised when you get to your appointment. ° °2) Contact Your Local Health Department °Not all health departments have doctors that can see patients for sick visits, but many do, so it is worth a call to see if yours does. If you don't know where your local health department is, you can check in your phone book. The CDC also has a tool to help you locate your state's health department, and many state websites also have listings of all of their local health departments. ° °3) Find a Walk-in Clinic °If your illness is not likely to be very severe or complicated, you may want to try a walk in clinic. These are popping up all over the country in pharmacies, drugstores, and shopping centers. They're usually staffed by nurse practitioners or physician assistants that have been trained to treat common illnesses and complaints. They're usually fairly quick and inexpensive. However, if you have serious medical issues or chronic medical problems, these are probably not your best option. ° °No Primary Care Doctor: °- Call [facility] at  832-8000 - they can help you locate a primary care doctor that  accepts your insurance, provides certain services, etc. °- Physician Referral Service- 1-800-533-3463 ° °Chronic Pain Problems: °Organization         Address  Phone   Notes  °[facility]  ([phone]) [phone] Patients need to be referred by their primary care doctor.  ° °Medication  Assistance: °Organization         Address  Phone   Notes  °[place] [facility] [address] °[place], [place] [address] (336) 641-8030 --Must be a resident of [place] °-- Must have NO insurance coverage whatsoever (no Medicaid/ Medicare, etc.) °-- The pt. MUST have a primary care doctor that directs their care regularly and follows them in the community °  °[facility]  (866) 331-1348   °[employer]  (888) 892-1162   ° °Agencies that provide inexpensive medical care: °Organization         Address  Phone   Notes  °[facility]  (336) 832-8035   °[facility]    (336) 832-7272   °[facility] [address] °[place], [place] [address] (336) 832-4777   °[facility] [address], °[place] (336) 271-4999   °[facility]    (336) 373-0678   °[facility]    (336) 272-1050   °[facility] ° [address], [place] Phone:  (336) 832-4444, Fax:  (336) 832-4440 Hours of Operation:  9 am - 6 pm, M-F.  Also accepts Medicaid/Medicare and self-pay.  °[facility] ° [address], [place] Phone: (336) 832-3150, Fax: (336) [phone]. Hours of Operation:  8:30 am - 5:30 pm, M-F.  Also accepts Medicaid and self-pay.  °[facility] [place] [address], [place] Phone: (336) 878-6027   °[facility] [address], [place], [place] (336)723-1848, Ext. 123 Mondays & Thursdays: 7-9 AM.  First 15 patients are seen on a first come, first serve basis. °  ° °Medicaid-accepting [place] Providers: ° °Organization         Address  Phone   Notes  °[facility] [address], [place] (336) 641-2100 Also accepts self-pay patients.  °[facility] [address], [place] ° (336) 856-9996   °[facility] [address], [place]  (336) 288-8857   °[facility] [address] [place] [address], [place] (336) 299-7000   °Ve[facility] B[facility] [address], [place]  ° (336) 373-1557 Only accepts [place] Access Medicaid patients after they have their name applied to their card.  ° °Self-Pay (no insurance) in [place]: ° °Organization         Address  Phone   Notes  °Sickle Cell Patients, [facility] [address], [place] (336) 832-1970   °[facility] [address], [place] (336) 832-4400   °[facility] ° [address],  (336) 992-4800   °[facility]/Dr. Osei-Bonsu ° [address] [place] [address], [place] or [address], [place] (336) [phone] Phone number for both [place] and [place] locations is the same.  °[facility] [address], [place] (336) 299-0000   °[facility] [address] [place] [address], [place] or [address] (336) 852-7530 °(336) 878-2260   °[facility] [address], [place] (336) 350-1642, phone; (336) 294-5005, fax Sees patients 1st and 3rd Saturday of every month.  Must not qualify for public or private insurance (i.e. Medicaid, Medicare, [place] Health Choice, Veterans' Benefits) • Household income should be no more than 200% of the poverty level •The clinic cannot treat you if you are pregnant or think you are pregnant • Sexually transmitted diseases are not treated at the clinic.  ° ° °Dental Care: °Organization         Address  Phone  Notes  °[place] [facility] [address], [place] (336) 641-6152 Accepts children up to age 21 who are enrolled in Medicaid or [place] Health Choice; pregnant women with a Medicaid card; and children who have applied for Medicaid or [place] Health Choice, but were declined, whose parents can pay a reduced fee at time of service.  °[place]  [facility] [place]  [address], [place] (336) 641-7733 Accepts children up to age 21 who are enrolled in Medicaid or [place] Health Choice; pregnant women with a Medicaid card; and children who have applied for Medicaid or  Health Choice, but were declined, whose parents can pay a reduced fee at time of service.  °[facility] ° [address], [place] (336) 641-4533 Patients are seen by appointment only. Walk-ins are not accepted. [facility] will see patients 18 years of age and older. °Monday - Tuesday (8am-5pm) °Most Wednesdays (8:30-5pm) °$30 per visit, cash only  °[facility] ° [address], [place] (336) 641-4533 Patients are seen by appointment only. Walk-ins are not accepted. [facility] will see patients 18 years of age and older. °One   Wednesday Evening (Monthly: Volunteer Based).  $30 per visit, cash only  °[facility]  (919) 537-3737 for adults; Children under age 4, call Graduate Pediatric Dentistry at ([phone]) [phone]. Children aged 4-14, please call ([phone]) [phone] to request a pediatric application. ° Dental services are provided in all areas of dental care including fillings, crowns and bridges, complete and partial dentures, implants, gum treatment, root canals, and extractions. Preventive care is also provided. Treatment is provided to both adults and children. °Patients are selected via a lottery and there is often a waiting list. °  °[facility] [address], °[place] ° (336) 763-8833 [email or web address] °  °[facility] [address], [place], [place] (336)723-1848, Ext. 123 Second and Fourth Thursday of each month, opens at 6:30 AM; Clinic ends at 9 AM.  Patients are seen on a first-come first-served basis, and a limited number are seen during each clinic.  ° °[facility] ° [address], [place], [place] (336) [phone]    Eligibility Requirements °You must have lived in [place], or [place] counties for at least the last three months. °  You cannot be eligible for state or federal sponsored healthcare insurance, including Veterans Administration, Medicaid, or Medicare. °  You generally cannot be eligible for healthcare insurance through your employer.  °  How to apply: °Eligibility screenings are held every Tuesday and Wednesday afternoon from 1:00 pm until 4:00 pm. You do not need an appointment for the interview!  °[facility] [address], [place], [place] 336-631-2330   °[facility]  336-342-8273   °[facility]  336-703-3100   °[facility]  336-570-6415   ° °Behavioral Health Resources in the Community: °Intensive Outpatient Programs °Organization         Address  Phone  Notes  °[facility] [address], [place], [place] 336-878-6098   °[facility] [address], [place], [place] 336-832-9800   °[facility] [address], [place], [place] ° 336-882-2125   °[facility] [address],  °[place], [place] 1-800-853-5163 or 336-641-4981   °Substance Abuse Resources °Organization         Address  Phone  Notes  °[facility]  336-882-2125   °[facility]  336-784-9470   °[facility]  336-285-9073   °[facility]  336-845-3988   °Residential & Outpatient Substance Abuse Program  1-800-659-3381   °Psychological Services °Organization         Address  Phone  Notes  °[facility]  336- 832-9600   °[facility]  336- 378-7881   °[facility] [address], [place] 1-800-853-5163 or 336-641-4981   ° °Mobile Crisis Teams °Organization         Address  Phone  Notes  °[facility], Mobile Crisis Care Unit  1-877-626-1772   °Assertive °[facility] ° [address]  [place], [place] 336-834-9664   °Sharon DeEsch [address] °[place] 336-554-5454   ° °Self-Help/Support Groups °Organization         Address  Phone             Notes  °[facility]  - variety of support groups  336- 373-1402 Call for more information  °Narcotics Anonymous (NA), [facility] [address], °[place]  2 meetings at this location  ° °  Residential Treatment Programs Organization         Address  Phone  Notes  [facility] [address],    [place]  1-(703)719-6356   [facility]  [address] [place] [id number], [place], [place]   [place], [place] [phone] Admissions: 8am-3pm M-F  [facility] [place] [address]    [place], [place] X4321937   [facility] [address] [facility], [place], [place]   [facility] [address],  [place], [place]   [facility] Intensive Outpatient [address], [place], [place]   [facility] [place]  [place], [place] 1-226-405-2723 or (903) 836-7484   [facility] [address], [place], [place] Accepts Medicaid  [facility] [address],  [place] 1-760 642 6575 Substance Abuse/Addiction Treatment   [facility] Organization         Address  Phone  Notes  [facility]  (402) 116-2840   Domenic Schwab, PhD [address] [place], [place]   775-014-2306 or (817)541-2036   [facility] [place], [place] 657-476-3191   [facility] [address] [place], [place] 579-375-7280 Insurance/Medicaid/sponsorship through [facility] and Families [address] [place]                                    [place], [place] [phone] [place] [address], [place] [phone]    Dr. Adele Schilder  (437) [phone]   [facility] Dept. 1) [address] [place] 2) [place] 3)  [address], [place] [phone]  (347) [phone]   [place] (858)517-1418 or 830-316-6091 (After Hours)      Take your usual prescriptions as previously directed.  Wash the skin tear areas with soap and water at least once a day, and cover with a clean/dry dressing.  Change the dressing whenever it becomes wet or soiled after washing the area with soap and water. Walk with your walker. Wear your hard cervical collar at all times.  Call your regular medical doctor tomorrow to schedule a follow up appointment for a recheck within the next 2 days.  Return to the Emergency Department immediately if worsening.

## 2015-10-25 ENCOUNTER — Emergency Department (HOSPITAL_COMMUNITY): Payer: Medicare Other

## 2015-10-25 ENCOUNTER — Encounter (HOSPITAL_COMMUNITY): Payer: Self-pay | Admitting: Emergency Medicine

## 2015-10-25 ENCOUNTER — Inpatient Hospital Stay (HOSPITAL_COMMUNITY)
Admission: EM | Admit: 2015-10-25 | Discharge: 2015-10-30 | DRG: 481 | Disposition: A | Discharge status: Skilled Nursing Facility | Payer: Medicare Other | Attending: Internal Medicine | Admitting: Internal Medicine

## 2015-10-25 DIAGNOSIS — E079 Disorder of thyroid, unspecified: Secondary | ICD-10-CM | POA: Diagnosis present

## 2015-10-25 DIAGNOSIS — F0390 Unspecified dementia without behavioral disturbance: Secondary | ICD-10-CM | POA: Diagnosis not present

## 2015-10-25 DIAGNOSIS — I10 Essential (primary) hypertension: Secondary | ICD-10-CM | POA: Diagnosis not present

## 2015-10-25 DIAGNOSIS — I110 Hypertensive heart disease with heart failure: Secondary | ICD-10-CM | POA: Diagnosis present

## 2015-10-25 DIAGNOSIS — M1990 Unspecified osteoarthritis, unspecified site: Secondary | ICD-10-CM | POA: Diagnosis not present

## 2015-10-25 DIAGNOSIS — I422 Other hypertrophic cardiomyopathy: Secondary | ICD-10-CM

## 2015-10-25 DIAGNOSIS — I350 Nonrheumatic aortic (valve) stenosis: Secondary | ICD-10-CM | POA: Diagnosis not present

## 2015-10-25 DIAGNOSIS — I2510 Atherosclerotic heart disease of native coronary artery without angina pectoris: Secondary | ICD-10-CM | POA: Diagnosis present

## 2015-10-25 DIAGNOSIS — G309 Alzheimer's disease, unspecified: Secondary | ICD-10-CM | POA: Diagnosis present

## 2015-10-25 DIAGNOSIS — S41112A Laceration without foreign body of left upper arm, initial encounter: Secondary | ICD-10-CM | POA: Diagnosis not present

## 2015-10-25 DIAGNOSIS — F419 Anxiety disorder, unspecified: Secondary | ICD-10-CM | POA: Diagnosis not present

## 2015-10-25 DIAGNOSIS — S72101A Unspecified trochanteric fracture of right femur, initial encounter for closed fracture: Secondary | ICD-10-CM | POA: Diagnosis present

## 2015-10-25 DIAGNOSIS — R296 Repeated falls: Secondary | ICD-10-CM | POA: Diagnosis present

## 2015-10-25 DIAGNOSIS — Z66 Do not resuscitate: Secondary | ICD-10-CM | POA: Diagnosis present

## 2015-10-25 DIAGNOSIS — T148 Other injury of unspecified body region: Secondary | ICD-10-CM

## 2015-10-25 DIAGNOSIS — I509 Heart failure, unspecified: Secondary | ICD-10-CM | POA: Diagnosis present

## 2015-10-25 DIAGNOSIS — I252 Old myocardial infarction: Secondary | ICD-10-CM

## 2015-10-25 DIAGNOSIS — Z79899 Other long term (current) drug therapy: Secondary | ICD-10-CM | POA: Diagnosis not present

## 2015-10-25 DIAGNOSIS — F0280 Dementia in other diseases classified elsewhere without behavioral disturbance: Secondary | ICD-10-CM | POA: Diagnosis present

## 2015-10-25 DIAGNOSIS — M25551 Pain in right hip: Secondary | ICD-10-CM

## 2015-10-25 DIAGNOSIS — T148XXA Other injury of unspecified body region, initial encounter: Secondary | ICD-10-CM

## 2015-10-25 DIAGNOSIS — I4891 Unspecified atrial fibrillation: Secondary | ICD-10-CM | POA: Diagnosis present

## 2015-10-25 DIAGNOSIS — Y92009 Unspecified place in unspecified non-institutional (private) residence as the place of occurrence of the external cause: Secondary | ICD-10-CM | POA: Diagnosis not present

## 2015-10-25 DIAGNOSIS — J690 Pneumonitis due to inhalation of food and vomit: Secondary | ICD-10-CM

## 2015-10-25 DIAGNOSIS — I714 Abdominal aortic aneurysm, without rupture: Secondary | ICD-10-CM | POA: Diagnosis present

## 2015-10-25 DIAGNOSIS — W19XXXA Unspecified fall, initial encounter: Secondary | ICD-10-CM

## 2015-10-25 DIAGNOSIS — D638 Anemia in other chronic diseases classified elsewhere: Secondary | ICD-10-CM | POA: Diagnosis present

## 2015-10-25 DIAGNOSIS — I739 Peripheral vascular disease, unspecified: Secondary | ICD-10-CM | POA: Diagnosis present

## 2015-10-25 DIAGNOSIS — S12100A Unspecified displaced fracture of second cervical vertebra, initial encounter for closed fracture: Secondary | ICD-10-CM | POA: Diagnosis present

## 2015-10-25 DIAGNOSIS — Z419 Encounter for procedure for purposes other than remedying health state, unspecified: Secondary | ICD-10-CM

## 2015-10-25 DIAGNOSIS — Z09 Encounter for follow-up examination after completed treatment for conditions other than malignant neoplasm: Secondary | ICD-10-CM

## 2015-10-25 DIAGNOSIS — W06XXXA Fall from bed, initial encounter: Secondary | ICD-10-CM | POA: Diagnosis present

## 2015-10-25 DIAGNOSIS — Z01810 Encounter for preprocedural cardiovascular examination: Secondary | ICD-10-CM | POA: Diagnosis not present

## 2015-10-25 DIAGNOSIS — M4800 Spinal stenosis, site unspecified: Secondary | ICD-10-CM | POA: Diagnosis not present

## 2015-10-25 DIAGNOSIS — S72141A Displaced intertrochanteric fracture of right femur, initial encounter for closed fracture: Principal | ICD-10-CM | POA: Diagnosis present

## 2015-10-25 DIAGNOSIS — S72001A Fracture of unspecified part of neck of right femur, initial encounter for closed fracture: Secondary | ICD-10-CM

## 2015-10-25 DIAGNOSIS — R739 Hyperglycemia, unspecified: Secondary | ICD-10-CM | POA: Diagnosis present

## 2015-10-25 DIAGNOSIS — Z23 Encounter for immunization: Secondary | ICD-10-CM

## 2015-10-25 DIAGNOSIS — D72829 Elevated white blood cell count, unspecified: Secondary | ICD-10-CM | POA: Diagnosis not present

## 2015-10-25 DIAGNOSIS — S41111A Laceration without foreign body of right upper arm, initial encounter: Secondary | ICD-10-CM | POA: Diagnosis present

## 2015-10-25 DIAGNOSIS — R000 Tachycardia, unspecified: Secondary | ICD-10-CM | POA: Diagnosis not present

## 2015-10-25 HISTORY — DX: Other hypertrophic cardiomyopathy: I42.2

## 2015-10-25 HISTORY — DX: Melena: K92.1

## 2015-10-25 HISTORY — DX: Spinal stenosis, site unspecified: M48.00

## 2015-10-25 LAB — CBC WITH DIFFERENTIAL/PLATELET
Basophils Absolute: 0 10*3/uL (ref 0.0–0.1)
Basophils Relative: 0 %
EOS ABS: 0 10*3/uL (ref 0.0–0.7)
EOS PCT: 0 %
HCT: 33 % — ABNORMAL LOW (ref 36.0–46.0)
Hemoglobin: 11.2 g/dL — ABNORMAL LOW (ref 12.0–15.0)
LYMPHS ABS: 0.8 10*3/uL (ref 0.7–4.0)
Lymphocytes Relative: 5 %
MCH: 33.5 pg (ref 26.0–34.0)
MCHC: 33.9 g/dL (ref 30.0–36.0)
MCV: 98.8 fL (ref 78.0–100.0)
MONO ABS: 0.9 10*3/uL (ref 0.1–1.0)
MONOS PCT: 5 %
Neutro Abs: 15.6 10*3/uL — ABNORMAL HIGH (ref 1.7–7.7)
Neutrophils Relative %: 90 %
PLATELETS: 272 10*3/uL (ref 150–400)
RBC: 3.34 MIL/uL — ABNORMAL LOW (ref 3.87–5.11)
RDW: 13.4 % (ref 11.5–15.5)
WBC: 17.3 10*3/uL — ABNORMAL HIGH (ref 4.0–10.5)

## 2015-10-25 LAB — COMPREHENSIVE METABOLIC PANEL
ALT: 15 U/L (ref 14–54)
ANION GAP: 9 (ref 5–15)
AST: 30 U/L (ref 15–41)
Albumin: 3.3 g/dL — ABNORMAL LOW (ref 3.5–5.0)
Alkaline Phosphatase: 90 U/L (ref 38–126)
BUN: 17 mg/dL (ref 6–20)
CALCIUM: 8.7 mg/dL — AB (ref 8.9–10.3)
CHLORIDE: 95 mmol/L — AB (ref 101–111)
CO2: 30 mmol/L (ref 22–32)
Creatinine, Ser: 0.74 mg/dL (ref 0.44–1.00)
GFR calc non Af Amer: >60 mL/min (ref >60)
Glucose, Bld: 175 mg/dL — ABNORMAL HIGH (ref 65–99)
Potassium: 4.5 mmol/L (ref 3.5–5.1)
SODIUM: 134 mmol/L — AB (ref 135–145)
Total Bilirubin: 0.7 mg/dL (ref 0.3–1.2)
Total Protein: 6.5 g/dL (ref 6.5–8.1)

## 2015-10-25 LAB — URINALYSIS, ROUTINE W REFLEX MICROSCOPIC
Bilirubin Urine: NEGATIVE
Glucose, UA: NEGATIVE mg/dL
KETONES UR: NEGATIVE mg/dL
LEUKOCYTES UA: NEGATIVE
NITRITE: NEGATIVE
PH: 5.5 (ref 5.0–8.0)
Protein, ur: NEGATIVE mg/dL
SPECIFIC GRAVITY, URINE: 1.025 (ref 1.005–1.030)

## 2015-10-25 LAB — URINE MICROSCOPIC-ADD ON
Bacteria, UA: NONE SEEN
WBC UA: NONE SEEN WBC/hpf (ref 0–5)

## 2015-10-25 MED ORDER — DEXTROSE-NACL 5-0.9 % IV SOLN
INTRAVENOUS | Status: DC
  Administered 2015-10-26: 70 mL/h via INTRAVENOUS

## 2015-10-25 MED ORDER — ONDANSETRON HCL 4 MG PO TABS: 4.0000 mg | ORAL_TABLET | Freq: Four times a day (QID) | ORAL | Status: DC | PRN

## 2015-10-25 MED ORDER — ACETAMINOPHEN 650 MG RE SUPP: 650.0000 mg | Freq: Four times a day (QID) | RECTAL | Status: DC | PRN

## 2015-10-25 MED ORDER — ONDANSETRON HCL 4 MG/2ML IJ SOLN: 4.0000 mg | Freq: Four times a day (QID) | INTRAMUSCULAR | Status: DC | PRN

## 2015-10-25 MED ORDER — ACETAMINOPHEN 325 MG PO TABS: 650.0000 mg | ORAL_TABLET | Freq: Four times a day (QID) | ORAL | Status: DC | PRN

## 2015-10-25 MED ORDER — MORPHINE SULFATE (PF) 2 MG/ML IV SOLN
1.0000 mg | INTRAVENOUS | Status: DC | PRN
  Administered 2015-10-26 – 2015-10-27 (×7): 1 mg via INTRAVENOUS
  Filled 2015-10-25 (×7): qty 1

## 2015-10-25 MED ORDER — SODIUM CHLORIDE 0.9 % IV BOLUS (SEPSIS)
500.0000 mL | Freq: Once | INTRAVENOUS | Status: AC
  Administered 2015-10-25: 500 mL via INTRAVENOUS

## 2015-10-25 MED ORDER — TETANUS-DIPHTH-ACELL PERTUSSIS 5-2.5-18.5 LF-MCG/0.5 IM SUSP
0.5000 mL | Freq: Once | INTRAMUSCULAR | Status: AC
  Administered 2015-10-25: 0.5 mL via INTRAMUSCULAR
  Filled 2015-10-25: qty 0.5

## 2015-10-25 MED ORDER — METOPROLOL TARTRATE 1 MG/ML IV SOLN
2.5000 mg | Freq: Four times a day (QID) | INTRAVENOUS | Status: DC
  Administered 2015-10-26 – 2015-10-29 (×10): 2.5 mg via INTRAVENOUS
  Filled 2015-10-25 (×15): qty 5

## 2015-10-25 MED ORDER — ALBUTEROL SULFATE (2.5 MG/3ML) 0.083% IN NEBU: 2.5000 mg | INHALATION_SOLUTION | RESPIRATORY_TRACT | Status: DC | PRN

## 2015-10-25 NOTE — ED Notes (Signed)
Cleaned skin tears with sur clens and applied telfa and cling

## 2015-10-25 NOTE — Consult Note (Signed)
Barbara Snyder is an 82 y.o. female.    Chief Complaint: right hip pain  HPI: 82 y/o female fell earlier today onto right hip while trying to go from bedside commode back to her bed. Currently pt is alert and awake but not aware of date or time. C/o moderate right hip pain and inability to bear weight to right lower extremity. POA Barbara Snyder is pt's caregiver but currently not present after pt transferred from [facility]. Hx of old C2 fracture and pt currently in Aspen collar. Normal ambulation and activity level unable to be assessed.  PCP:  Barbara Noble, MD  PMH: Past Medical History  Diagnosis Date  . Alzheimer disease   . Hypertension   . CHF (congestive heart failure) ([place])   . Arthritis   . Thyroid disease   . Myocardial infarct ([place]) 2001  . S/P colonoscopy Apr 2004    left-sided diverticula, internal hemorrhoids ([facility])  . Hip pain   . Skin rash   . Cholesterol blood decreased   . Trouble in sleeping   . Anxiety   . Coronary artery disease   . Abdominal aortic aneurysm ([place])   . Frequent falls   . Closed C2 fracture ([place]) 03/2015  . Hematochezia 06/16/2011  . SPINAL STENOSIS 01/14/2010    Qualifier: Diagnosis of  By: Aline Brochure MD, Dorothyann Peng    . Hypertrophic cardiomyopathy ([place]) 2011    Per echo 2011    PSH: Past Surgical History  Procedure Laterality Date  . Mastoidectomy    . Colonoscopy  12/20/02    internal hemorrhoids otherwise normal/left-side diverticula  . Colonoscopy  10/16/2011    Procedure: COLONOSCOPY;  Surgeon: Daneil Dolin, MD;  Location: [facility];  Service: Endoscopy;  Laterality: N/A;  10:00- pts family request this time     Social History:  reports that she has never smoked. She has never used smokeless tobacco. She reports that she does not drink alcohol or use illicit drugs.  Allergies:  No Known Allergies  Medications: Current Facility-Administered Medications  Medication Dose Route Frequency Provider Last Rate Last Dose  . acetaminophen  (TYLENOL) tablet 650 mg  650 mg Oral Q6H PRN Rexene Alberts, MD       Or  . acetaminophen (TYLENOL) suppository 650 mg  650 mg Rectal Q6H PRN Rexene Alberts, MD      . albuterol (PROVENTIL) (2.5 MG/3ML) 0.083% nebulizer solution 2.5 mg  2.5 mg Nebulization Q2H PRN Rexene Alberts, MD      . dextrose 5 %-0.9 % sodium chloride infusion   Intravenous Continuous Rexene Alberts, MD      . metoprolol (LOPRESSOR) injection 2.5 mg  2.5 mg Intravenous 4 times per day Rexene Alberts, MD      . morphine 2 MG/ML injection 1 mg  1 mg Intravenous Q3H PRN Rexene Alberts, MD      . ondansetron (ZOFRAN) tablet 4 mg  4 mg Oral Q6H PRN Rexene Alberts, MD       Or  . ondansetron [facility]) injection 4 mg  4 mg Intravenous Q6H PRN Rexene Alberts, MD        Results for orders placed or performed during the hospital encounter of 10/25/15 (from the past 48 hour(s))  CBC with Differential/Platelet     Status: Abnormal   Collection Time: 10/25/15 11:55 AM  Result Value Ref Range   WBC 17.3 (H) 4.0 - 10.5 K/uL   RBC 3.34 (L) 3.87 - 5.11 MIL/uL   Hemoglobin 11.2 (L) 12.0 -  15.0 g/dL   HCT 33.0 (L) 36.0 - 46.0 %   MCV 98.8 78.0 - 100.0 fL   MCH 33.5 26.0 - 34.0 pg   MCHC 33.9 30.0 - 36.0 g/dL   RDW 13.4 11.5 - 15.5 %   Platelets 272 150 - 400 K/uL   Neutrophils Relative % 90 %   Neutro Abs 15.6 (H) 1.7 - 7.7 K/uL   Lymphocytes Relative 5 %   Lymphs Abs 0.8 0.7 - 4.0 K/uL   Monocytes Relative 5 %   Monocytes Absolute 0.9 0.1 - 1.0 K/uL   Eosinophils Relative 0 %   Eosinophils Absolute 0.0 0.0 - 0.7 K/uL   Basophils Relative 0 %   Basophils Absolute 0.0 0.0 - 0.1 K/uL  Comprehensive metabolic panel     Status: Abnormal   Collection Time: 10/25/15 11:55 AM  Result Value Ref Range   Sodium 134 (L) 135 - 145 mmol/L   Potassium 4.5 3.5 - 5.1 mmol/L   Chloride 95 (L) 101 - 111 mmol/L   CO2 30 22 - 32 mmol/L   Glucose, Bld 175 (H) 65 - 99 mg/dL   BUN 17 6 - 20 mg/dL   Creatinine, Ser 0.74 0.44 - 1.00 mg/dL   Calcium  8.7 (L) 8.9 - 10.3 mg/dL   Total Protein 6.5 6.5 - 8.1 g/dL   Albumin 3.3 (L) 3.5 - 5.0 g/dL   AST 30 15 - 41 U/L   ALT 15 14 - 54 U/L   Alkaline Phosphatase 90 38 - 126 U/L   Total Bilirubin 0.7 0.3 - 1.2 mg/dL   GFR calc non Af Amer >60 >60 mL/min   GFR calc Af Amer >60 >60 mL/min    Comment: (NOTE) The eGFR has been calculated using the CKD EPI equation. This calculation has not been validated in all clinical situations. eGFR's persistently <60 mL/min signify possible Chronic Kidney Disease.    Anion gap 9 5 - 15  Urinalysis, Routine w reflex microscopic (not at [facility])     Status: Abnormal   Collection Time: 10/25/15 12:15 PM  Result Value Ref Range   Color, Urine YELLOW YELLOW   APPearance CLEAR CLEAR   Specific Gravity, Urine 1.025 1.005 - 1.030   pH 5.5 5.0 - 8.0   Glucose, UA NEGATIVE NEGATIVE mg/dL   Hgb urine dipstick TRACE (A) NEGATIVE   Bilirubin Urine NEGATIVE NEGATIVE   Ketones, ur NEGATIVE NEGATIVE mg/dL   Protein, ur NEGATIVE NEGATIVE mg/dL   Nitrite NEGATIVE NEGATIVE   Leukocytes, UA NEGATIVE NEGATIVE  Urine microscopic-add on     Status: Abnormal   Collection Time: 10/25/15 12:15 PM  Result Value Ref Range   Squamous Epithelial / LPF 0-5 (A) NONE SEEN   WBC, UA NONE SEEN 0 - 5 WBC/hpf   RBC / HPF 0-5 0 - 5 RBC/hpf   Bacteria, UA NONE SEEN NONE SEEN   Dg Chest 1 View  10/25/2015  CLINICAL DATA:  Right hip pain status post fall this morning. Alzheimer's. Currently unable to walk. EXAM: CHEST 1 VIEW COMPARISON:  Chest x-rays dated 08/14/2015 and 04/15/2015. FINDINGS: The severe dextroscoliosis of the thoracic spine appears stable. Multiple chronic-appearing compression fracture deformities are again seen within the thoracic spine, difficult to definitively characterize due to the scoliosis and diffuse osteopenia, but all appear chronic. No acute-appearing osseous abnormality seen. Cardiomediastinal silhouette is stable in size and configuration. Extensive  atherosclerotic calcifications again noted along the walls of the tortuous thoracic aorta. Lungs are clear. No  pleural effusion seen. No pneumothorax. IMPRESSION: No acute findings. Lungs are clear and there is no evidence of acute cardiopulmonary abnormality. Chronic-appearing osseous findings detailed above. Electronically Signed   By: Franki Cabot M.D.   On: 10/25/2015 11:44   Ct Head Wo Contrast  10/25/2015  CLINICAL DATA:  Fall EXAM: CT HEAD WITHOUT CONTRAST CT CERVICAL SPINE WITHOUT CONTRAST TECHNIQUE: Multidetector CT imaging of the head and cervical spine was performed following the standard protocol without intravenous contrast. Multiplanar CT image reconstructions of the cervical spine were also generated. COMPARISON:  10/10/2015 FINDINGS: CT HEAD FINDINGS Global atrophy. Chronic ischemic changes in the periventricular white matter. No mass effect, midline shift, or acute intracranial hemorrhage. There is trace fluid in the right mastoid air cells. Left mastoidectomy has been performed. No evidence of skull base or cranial fracture. CT CERVICAL SPINE FINDINGS The chronic fracture through the neck of the odontoid is stable. There is persistent and stable retrolisthesis of the odontoid with respect to the C2 body. The posterior arch of C1 protrudes through the foramen magnum and this results then consider oral narrowing of the central canal. Again, this is a stable finding. Osteopenia persists. Anterolisthesis C4 upon C5 is stable. Reversal of cervical lordosis is also stable. There is no obvious soft tissue injury. No obvious acute fracture line is present. IMPRESSION: There is a small amount of fluid in the right mastoid air cells but otherwise no evidence of acute intracranial pathology. This is a stable finding. Chronic odontoid neck fracture is stable. No acute fracture in the cervical spine. Electronically Signed   By: Marybelle Killings M.D.   On: 10/25/2015 11:51   Ct Cervical Spine Wo  Contrast  10/25/2015  CLINICAL DATA:  Fall EXAM: CT HEAD WITHOUT CONTRAST CT CERVICAL SPINE WITHOUT CONTRAST TECHNIQUE: Multidetector CT imaging of the head and cervical spine was performed following the standard protocol without intravenous contrast. Multiplanar CT image reconstructions of the cervical spine were also generated. COMPARISON:  10/10/2015 FINDINGS: CT HEAD FINDINGS Global atrophy. Chronic ischemic changes in the periventricular white matter. No mass effect, midline shift, or acute intracranial hemorrhage. There is trace fluid in the right mastoid air cells. Left mastoidectomy has been performed. No evidence of skull base or cranial fracture. CT CERVICAL SPINE FINDINGS The chronic fracture through the neck of the odontoid is stable. There is persistent and stable retrolisthesis of the odontoid with respect to the C2 body. The posterior arch of C1 protrudes through the foramen magnum and this results then consider oral narrowing of the central canal. Again, this is a stable finding. Osteopenia persists. Anterolisthesis C4 upon C5 is stable. Reversal of cervical lordosis is also stable. There is no obvious soft tissue injury. No obvious acute fracture line is present. IMPRESSION: There is a small amount of fluid in the right mastoid air cells but otherwise no evidence of acute intracranial pathology. This is a stable finding. Chronic odontoid neck fracture is stable. No acute fracture in the cervical spine. Electronically Signed   By: Marybelle Killings M.D.   On: 10/25/2015 11:51   Dg Hip Unilat With Pelvis 2-3 Views Right  10/25/2015  CLINICAL DATA:  Pain following fall earlier today EXAM: DG HIP (WITH OR WITHOUT PELVIS) 2-3V RIGHT COMPARISON:  June 10, 2013 FINDINGS: Frontal pelvis as well as frontal and lateral right hip images were obtained. There is a comminuted intertrochanteric femur fracture on the right with marked varus angulation at the fracture site. No other acute fractures are evident.  There are old fractures of each superior pubic ramus and ischium with remodeling. No dislocation. There is mild narrowing of both hip joints. Bones are diffusely osteoporotic. There is extensive arterial vascular calcification at multiple sites. IMPRESSION: Comminuted intertrochanteric femur fracture on the right with marked varus angulation at the fracture site. Old fractures of the superior and inferior pubic rami with remodeling. No dislocation. Diffuse osteoporosis. Extensive arterial vascular calcification present. Electronically Signed   By: Lowella Grip III M.D.   On: 10/25/2015 11:41   Dg Femur, Min 2 Views Right  10/25/2015  CLINICAL DATA:  False morning with right hip pain with known intratrochanteric femoral fracture, initial encounter EXAM: RIGHT FEMUR 2 VIEWS COMPARISON:  None. FINDINGS: Distal femur and knee joint appear within normal limits. The examination is somewhat limited secondary to the patient's inability to position herself. IMPRESSION: No acute abnormality is noted in the distal femur. Electronically Signed   By: Inez Catalina M.D.   On: 10/25/2015 15:12    ROS: ROS Hx of multiple falls in the past with skin tears, bruising and previous fractures Currently disoriented to time and place  Physical Exam: Alert but disoriented cachetic 82 y/o female Currently in aspen collar from previous C2 fracture Bilateral upper extremities show several skin tears but good rom No signs of fracture or deformity Currently lying on left side with contracted right leg Extreme bruising to bilateral tibia circumfrentially No signs of open fracture nv intact distally bilateral upper and lower extremities Physical Exam   Assessment/Plan Assessment: right intertroch femur fracture  Plan: Pt admitted by medical team Recommend surgery for right femur to allow for ambulation Will await medical clearance before proceeding with surgery but tentatively planning on surgery tomorrow Pain  management as needed Strict bedrest Will need to speak with POA about consent and surgical procedure

## 2015-10-25 NOTE — H&P (Addendum)
[facility] History and Physical  Barbara Snyder [id number] DOB: August 01, 1920 DOA: 10/25/2015  Referring physician: ED physician, Dr. Jeanell Sparrow PCP: Asencion Noble, MD   Chief Complaint: Fall with subsequent right hip pain.  HPI: Barbara Snyder is a 82 y.o. female with a history of CHF, CAD, AAA, DJD, spinal stenosis, multiple falls in the past leading to a C2 fracture and multiple pelvic fractures, and Alzheimer's dementia, who presents with a chief complaint of right hip pain. The history is provided primarily by her son and POA, [name] and the patient's caretaker, [name]. Apparently, the patient fell this morning after she returned to bed from the bedside commode. Per [name], she slipped off of the bed and fell on her right side while he had his back turned to her. She complained of right hip pain. She was unable to stand due to the pain. She also scraped her left leg and right arm. There was no head trauma or loss consciousness. There has been no recent chest pain, chest congestion, lower extremity edema, shortness of breath, cough, swelling in her legs, nausea, vomiting, or diarrhea. Occasionally she does have wheezing, but not lately. She has multiple bruising from falls in the past. Her skin tears easily.   In the ED, she is afebrile and hemodynamically stable. Her lab data are significant for serum sodium of 134, glucose of 175, WBC of 17.3, and an unremarkable urinalysis. Radiographic studies are significant for comminuted intratrochanteric femur fracture on the right with marked varus angulation at the fracture site  and old fractures of the superior and inferior pubic rami with remodeling. Chest x-ray reveals no acute findings. CT of the cervical spine reveals chronic odontoid neck fracture which is stable and no acute findings. CT of the head reveals small amount of right mastoid air cells, but no acute intracranial abnormality. She is being admitted for further evaluation and  management.    Review of Systems:  As above in history present illness. She is also somewhat forgetful; otherwise review of systems is negative.   Past Medical History  Diagnosis Date  . Alzheimer disease   . Hypertension   . CHF (congestive heart failure) ([place])   . Arthritis   . Thyroid disease   . Myocardial infarct ([place]) 2001  . S/P colonoscopy Apr 2004    left-sided diverticula, internal hemorrhoids ([facility])  . Hip pain   . Skin rash   . Cholesterol blood decreased   . Trouble in sleeping   . Anxiety   . Coronary artery disease   . Abdominal aortic aneurysm ([place])   . Frequent falls   . Closed C2 fracture ([place]) 03/2015  . Hematochezia 06/16/2011  . SPINAL STENOSIS 01/14/2010    Qualifier: Diagnosis of  By: Aline Brochure MD, Dorothyann Peng    . Hypertrophic cardiomyopathy ([place]) 2011    Per echo 2011   Past Surgical History  Procedure Laterality Date  . Mastoidectomy    . Colonoscopy  12/20/02    internal hemorrhoids otherwise normal/left-side diverticula  . Colonoscopy  10/16/2011    Procedure: COLONOSCOPY;  Surgeon: Daneil Dolin, MD;  Location: [facility];  Service: Endoscopy;  Laterality: N/A;  10:00- pts family request this time    Social History: she is widowed. She has 2 children. She currently lives with her son and POA Kamyia Fehl. She has a caretaker, [name]. She has no history of alcohol, tobacco, or illicit drug use. She generally ambulates with a walker. She is a DO NOT RESUSCITATE status.  No Known Allergies  Family History  Problem Relation Age of Onset  . Colon cancer Sister     Stage II, in remission  . Colon cancer      nephew; Stage IV    Prior to Admission medications   Medication Sig Start Date End Date Taking? Authorizing Provider  acetaminophen (TYLENOL) 325 MG tablet Take 325 mg by mouth every 6 (six) hours as needed. For pain   Yes Historical Provider, MD  ENSURE (ENSURE) Take 237 mLs by mouth daily.   Yes Historical Provider, MD  furosemide  (LASIX) 40 MG tablet Take 20 mg by mouth daily.    Yes Historical Provider, MD  metoprolol tartrate (LOPRESSOR) 25 MG tablet Take 12.5 mg by mouth 2 (two) times daily.     Yes Historical Provider, MD  Multiple Vitamins-Minerals (MULTIVITAMIN WITH MINERALS) tablet Take 1 tablet by mouth daily.     Yes Historical Provider, MD  sertraline (ZOLOFT) 50 MG tablet Take 50 mg by mouth daily. 04/14/13  Yes Historical Provider, MD  spironolactone (ALDACTONE) 25 MG tablet Take 25 mg by mouth daily.    Yes Historical Provider, MD   Physical Exam: Filed Vitals:   10/25/15 1051 10/25/15 1200 10/25/15 1202 10/25/15 1230  BP: 99/58 100/78 100/78 114/70  Pulse: 94  83 73  Temp: 97.3 F (36.3 C)  97.3 F (36.3 C)   TempSrc: Axillary  Rectal   Resp: 21 23 21 24   Weight: 29.484 kg (65 lb)     SpO2: 98%  96% 99%    Wt Readings from Last 3 Encounters:  10/25/15 29.484 kg (65 lb)  10/10/15 34.02 kg (75 lb)  08/14/15 68.04 kg (150 lb)    General:  elderly small framed 82 year old Caucasian woman in no acute distress.  Eyes: PERRL, normal lids, irises & conjunctiva; conjunctivae are clear and sclerae white.  ENT: grossly normal hearing; oropharynx reveals mildly dry mucous membranes.  Neck: cervical collar in place, not taken off.  Cardiovascular: S1, S2, with a 1 to 2/6 systolic murmur.  No LE edema. Telemetry: SR, no arrhythmias  Respiratory: Clear to auscultation bilaterally.  Normal respiratory effort. Abdomen: soft, positive bowel sounds, soft, nontender, nondistended.  Skin: large areas of ecchymosis/bruising on both arms and legs. Skin tears that are bleeding on the right arm and left leg.  Musculoskeletal: diffuse muscle atrophy and hypertrophic arthritic changes in her knees. Right hip with moderate pain and small amount of edema; right lower extremity with shortening. Psychiatric: grossly normal mood and affect. Neurologic: she is alert and oriented to herself, her caretaker, and son, but not  oriented to place. Cranial nerves II through XII are grossly intact. Her speech is clear.           Labs on Admission:  Basic Metabolic Panel:  Recent Labs Lab 10/25/15 1155  NA 134*  K 4.5  CL 95*  CO2 30  GLUCOSE 175*  BUN 17  CREATININE 0.74  CALCIUM 8.7*   Liver Function Tests:  Recent Labs Lab 10/25/15 1155  AST 30  ALT 15  ALKPHOS 90  BILITOT 0.7  PROT 6.5  ALBUMIN 3.3*   No results for input(s): LIPASE, AMYLASE in the last 168 hours. No results for input(s): AMMONIA in the last 168 hours. CBC:  Recent Labs Lab 10/25/15 1155  WBC 17.3*  NEUTROABS 15.6*  HGB 11.2*  HCT 33.0*  MCV 98.8  PLT 272   Cardiac Enzymes: No results for input(s): CKTOTAL, CKMB, CKMBINDEX, TROPONINI in  the last 168 hours.  BNP (last 3 results) No results for input(s): BNP in the last 8760 hours.  ProBNP (last 3 results) No results for input(s): PROBNP in the last 8760 hours.  CBG: No results for input(s): GLUCAP in the last 168 hours.  Radiological Exams on Admission: Dg Chest 1 View  10/25/2015  CLINICAL DATA:  Right hip pain status post fall this morning. Alzheimer's. Currently unable to walk. EXAM: CHEST 1 VIEW COMPARISON:  Chest x-rays dated 08/14/2015 and 04/15/2015. FINDINGS: The severe dextroscoliosis of the thoracic spine appears stable. Multiple chronic-appearing compression fracture deformities are again seen within the thoracic spine, difficult to definitively characterize due to the scoliosis and diffuse osteopenia, but all appear chronic. No acute-appearing osseous abnormality seen. Cardiomediastinal silhouette is stable in size and configuration. Extensive atherosclerotic calcifications again noted along the walls of the tortuous thoracic aorta. Lungs are clear. No pleural effusion seen. No pneumothorax. IMPRESSION: No acute findings. Lungs are clear and there is no evidence of acute cardiopulmonary abnormality. Chronic-appearing osseous findings detailed above.  Electronically Signed   By: Franki Cabot M.D.   On: 10/25/2015 11:44   Ct Head Wo Contrast  10/25/2015  CLINICAL DATA:  Fall EXAM: CT HEAD WITHOUT CONTRAST CT CERVICAL SPINE WITHOUT CONTRAST TECHNIQUE: Multidetector CT imaging of the head and cervical spine was performed following the standard protocol without intravenous contrast. Multiplanar CT image reconstructions of the cervical spine were also generated. COMPARISON:  10/10/2015 FINDINGS: CT HEAD FINDINGS Global atrophy. Chronic ischemic changes in the periventricular white matter. No mass effect, midline shift, or acute intracranial hemorrhage. There is trace fluid in the right mastoid air cells. Left mastoidectomy has been performed. No evidence of skull base or cranial fracture. CT CERVICAL SPINE FINDINGS The chronic fracture through the neck of the odontoid is stable. There is persistent and stable retrolisthesis of the odontoid with respect to the C2 body. The posterior arch of C1 protrudes through the foramen magnum and this results then consider oral narrowing of the central canal. Again, this is a stable finding. Osteopenia persists. Anterolisthesis C4 upon C5 is stable. Reversal of cervical lordosis is also stable. There is no obvious soft tissue injury. No obvious acute fracture line is present. IMPRESSION: There is a small amount of fluid in the right mastoid air cells but otherwise no evidence of acute intracranial pathology. This is a stable finding. Chronic odontoid neck fracture is stable. No acute fracture in the cervical spine. Electronically Signed   By: Marybelle Killings M.D.   On: 10/25/2015 11:51   Ct Cervical Spine Wo Contrast  10/25/2015  CLINICAL DATA:  Fall EXAM: CT HEAD WITHOUT CONTRAST CT CERVICAL SPINE WITHOUT CONTRAST TECHNIQUE: Multidetector CT imaging of the head and cervical spine was performed following the standard protocol without intravenous contrast. Multiplanar CT image reconstructions of the cervical spine were also  generated. COMPARISON:  10/10/2015 FINDINGS: CT HEAD FINDINGS Global atrophy. Chronic ischemic changes in the periventricular white matter. No mass effect, midline shift, or acute intracranial hemorrhage. There is trace fluid in the right mastoid air cells. Left mastoidectomy has been performed. No evidence of skull base or cranial fracture. CT CERVICAL SPINE FINDINGS The chronic fracture through the neck of the odontoid is stable. There is persistent and stable retrolisthesis of the odontoid with respect to the C2 body. The posterior arch of C1 protrudes through the foramen magnum and this results then consider oral narrowing of the central canal. Again, this is a stable finding. Osteopenia persists. Anterolisthesis  C4 upon C5 is stable. Reversal of cervical lordosis is also stable. There is no obvious soft tissue injury. No obvious acute fracture line is present. IMPRESSION: There is a small amount of fluid in the right mastoid air cells but otherwise no evidence of acute intracranial pathology. This is a stable finding. Chronic odontoid neck fracture is stable. No acute fracture in the cervical spine. Electronically Signed   By: Marybelle Killings M.D.   On: 10/25/2015 11:51   Dg Hip Unilat With Pelvis 2-3 Views Right  10/25/2015  CLINICAL DATA:  Pain following fall earlier today EXAM: DG HIP (WITH OR WITHOUT PELVIS) 2-3V RIGHT COMPARISON:  June 10, 2013 FINDINGS: Frontal pelvis as well as frontal and lateral right hip images were obtained. There is a comminuted intertrochanteric femur fracture on the right with marked varus angulation at the fracture site. No other acute fractures are evident. There are old fractures of each superior pubic ramus and ischium with remodeling. No dislocation. There is mild narrowing of both hip joints. Bones are diffusely osteoporotic. There is extensive arterial vascular calcification at multiple sites. IMPRESSION: Comminuted intertrochanteric femur fracture on the right with  marked varus angulation at the fracture site. Old fractures of the superior and inferior pubic rami with remodeling. No dislocation. Diffuse osteoporosis. Extensive arterial vascular calcification present. Electronically Signed   By: Lowella Grip III M.D.   On: 10/25/2015 11:41    EKG: Independently reviewed. Normal sinus rhythm with a heart rate of 80 bpm and some artifact. No acute ST or T-wave abnormalities.  Assessment/Plan Principal Problem:   Intertrochanteric fracture of right femur (HCC) Active Problems:   Multiple falls   Multiple skin tears   CAD (coronary artery disease)   Hypertrophic cardiomyopathy (HCC)   Essential hypertension   Alzheimer's dementia without behavioral disturbance   Leukocytosis   Hyperglycemia   1. Patient is a 82 year old woman with a history of multiple falls in the past with resultant C2 fracture which occurred in 2016, pelvic fractures in the past, and multiple skin tears. The etiology of her falls is likely related to spinal stenosis, DJD, and senile decline. Her son is her primary caretaker and he does not report any recent head injury from her falls. He denied loss of consciousness. Patient has also been stable with regard to her CHF. She has had no recent chest pain or hospitalization for CHF exacerbation. She is treated chronically with metoprolol, Lasix, and spironolactone. Her last recorded echo in 2011 revealed an EF of 75% and hypertrophic cardiomyopathy. She is a relatively high risk surgical candidate, but I believe that the benefit of surgical/operative repair outweighs the risk. Her son also desires that the patient undergo surgical repair of the fracture.  2.  Orthopedic surgery is not available at [facility] ED physician, Dr. Jeanell Sparrow discussed the patient with orthopedic surgeon, Dr.Swintek at [facility]. He will see the patient in consultation. The patient will be admitted to the [facility] service with Dr. Wyline Copas as the accepting  physician/hospitalist. 3. The patient will be kept nothing by mouth. Will hold her oral medications. Will start gentle IV fluids. Will order metoprolol IV. Would restart Lasix and spironolactone when she is no longer nothing by mouth. 4. Patient is noted to have leukocytosis, but she is afebrile, her chest x-ray reveals no pneumonia, and her urinalysis is unremarkable. Leukocytosis is likely reactive. We'll continue to monitor. 5. Hyperglycemia. The patient's blood glucose is 175. She has no history of diabetes. Will order  hemoglobin A1c and monitor her CBG each morning. She will be receiving dextrose in the gentle IV fluids. If her blood glucose remains elevated, would start sliding scale NovoLog and follow up on the A1c result. 6. Will order wound care consult for evaluation and treatment of the multiple skin tears, none appear infected. 7. Will order a Foley catheter due to her inability to ambulate and pain with manipulation of her hip.    Code Status: DO NOT RESUSCITATE  DVT Prophylaxis: SCDs  Family Communication: discussed with son/POA Robert  Disposition Plan: will likely need SNF when clinically appropriate.   Time spent: one hour   [facility] Pager [phone]

## 2015-10-25 NOTE — ED Notes (Signed)
Patient arrives with son with c/o fall at home. Patient with multiple skin tears. One appears infected with green/yellow drainage, redness that moves up left arm. Son reports neck fracture that is old. Arrived with c-collar in place. Right hip pain, deformity noted.

## 2015-10-25 NOTE — ED Provider Notes (Signed)
CSN: [id number]     Arrival date & time 10/25/15  1042 History  By signing my name below, I, Rohini Rajnarayanan, attest that this documentation has been prepared under the direction and in the presence of Pattricia Boss, MD Electronically Signed: Evonnie Dawes, ED Scribe 10/25/2015 at 12:17 PM.   Chief Complaint  Patient presents with  . Fall   HPI  HPI Comments: Barbara Snyder is a 82 y.o. female with a pmhx of a closed C1 fracture, dementia, rheumatoid arthritis, CHF, HTN,  CAD, abdominal aneurism, and thyroid disease. who presents to the Emergency Department complaining of a fall that occurred this morning. She was sitting on the bed and lost her balance and fell off of the bed. Pt does not remember the fall. She currently lives with her son. Pt is currently complaining of new, right hip and leg pain. Pt has two small skin tears on her left and right arm that her son bandaged for her PTA.  Pt has a bruise on her forehead from a fall in December. Pt always wears a C-collar s/p her spinal injury. Pt was never a smoker. Pt has not had a tetanus shot recently. Pt's PCP is Dr. Asencion Noble in [place].   Past Medical History  Diagnosis Date  . Alzheimer disease   . Hypertension   . CHF (congestive heart failure) ([place])   . Arthritis   . Thyroid disease   . Myocardial infarct ([place]) 2001  . S/P colonoscopy Apr 2004    left-sided diverticula, internal hemorrhoids ([facility])  . Hip pain   . Skin rash   . Cholesterol blood decreased   . Trouble in sleeping   . Anxiety   . Coronary artery disease   . Abdominal aortic aneurysm ([place])   . Frequent falls   . Closed C2 fracture ([place]) 03/2015   Past Surgical History  Procedure Laterality Date  . Mastoidectomy    . Colonoscopy  12/20/02    internal hemorrhoids otherwise normal/left-side diverticula  . Colonoscopy  10/16/2011    Procedure: COLONOSCOPY;  Surgeon: Daneil Dolin, MD;  Location: [facility];  Service: Endoscopy;  Laterality: N/A;   10:00- pts family request this time    Family History  Problem Relation Age of Onset  . Colon cancer Sister     Stage II, in remission  . Colon cancer      nephew; Stage IV   Social History  Substance Use Topics  . Smoking status: Never Smoker   . Smokeless tobacco: Never Used  . Alcohol Use: No   OB History    Gravida Para Term Preterm AB TAB SAB Ectopic Multiple Living   2 2 2       2      Review of Systems  Musculoskeletal: Positive for myalgias (Right hip and leg).  Skin: Positive for wound.  All other systems reviewed and are negative.  Allergies  Review of patient's allergies indicates no known allergies.  Home Medications   Prior to Admission medications   Medication Sig Start Date End Date Taking? Authorizing Provider  acetaminophen (TYLENOL) 325 MG tablet Take 325 mg by mouth every 6 (six) hours as needed. For pain   Yes Historical Provider, MD  ENSURE (ENSURE) Take 237 mLs by mouth daily.   Yes Historical Provider, MD  furosemide (LASIX) 40 MG tablet Take 20 mg by mouth daily.    Yes Historical Provider, MD  metoprolol tartrate (LOPRESSOR) 25 MG tablet Take 12.5 mg by mouth  2 (two) times daily.     Yes Historical Provider, MD  Multiple Vitamins-Minerals (MULTIVITAMIN WITH MINERALS) tablet Take 1 tablet by mouth daily.     Yes Historical Provider, MD  sertraline (ZOLOFT) 50 MG tablet Take 50 mg by mouth daily. 04/14/13  Yes Historical Provider, MD  spironolactone (ALDACTONE) 25 MG tablet Take 25 mg by mouth daily.    Yes Historical Provider, MD   BP 99/58 mmHg  Pulse 94  Temp(Src) 97.3 F (36.3 C) (Axillary)  Resp 21  Wt 65 lb (29.484 kg)  SpO2 98% Physical Exam  Constitutional: She appears well-developed. No distress.  Elderly and cacechtic  HENT:  Right Ear: External ear normal.  Left Ear: External ear normal.  Nose: Nose normal.  Mouth/Throat: Oropharynx is clear and moist.  Bluish-green contusion on forehead. No other injuries today.   Eyes: Pupils  are equal, round, and reactive to light.  Neck: No JVD present. No tracheal deviation present. No thyromegaly present.  Aspen cervical collar in place.  Neck has some chronic changes. No focal tenderness.   Cardiovascular: Normal rate, regular rhythm and normal heart sounds.   Pulmonary/Chest: Effort normal and breath sounds normal.  Abdominal: Soft. Bowel sounds are normal.  No trunk injuries. Abdomen is soft and non-tender  Musculoskeletal: She exhibits no edema.  Chronic skin changes to b/l extremeties. Pt has right hip pain.  Dressing in place over skin tears on the LUE and RUE. There is a skin tear on the right forearm- 5 cm.  Swelling ttp right hip  Neurological: She is alert. No cranial nerve deficit. Coordination normal.  Skin: Skin is warm and dry. She is not diaphoretic.  Psychiatric: She has a normal mood and affect.  Nursing note and vitals reviewed.   ED Course  Procedures  DIAGNOSTIC STUDIES: Oxygen Saturation is 98% on RA, normal by my interpretation.    COORDINATION OF CARE: 11:10 AM-Discussed treatment plan which includes DG right hip, DG Chest, CT head wo contrast, CT Cervical spine, blood work, EKG, in and out cath, and rectal temperature check,  with pt at bedside and pt agreed to plan.    Labs Review Labs Reviewed  CBC WITH DIFFERENTIAL/PLATELET  COMPREHENSIVE METABOLIC PANEL  URINALYSIS, ROUTINE W REFLEX MICROSCOPIC (NOT AT [facility])    Imaging Review Dg Chest 1 View  10/25/2015  CLINICAL DATA:  Right hip pain status post fall this morning. Alzheimer's. Currently unable to walk. EXAM: CHEST 1 VIEW COMPARISON:  Chest x-rays dated 08/14/2015 and 04/15/2015. FINDINGS: The severe dextroscoliosis of the thoracic spine appears stable. Multiple chronic-appearing compression fracture deformities are again seen within the thoracic spine, difficult to definitively characterize due to the scoliosis and diffuse osteopenia, but all appear chronic. No acute-appearing osseous  abnormality seen. Cardiomediastinal silhouette is stable in size and configuration. Extensive atherosclerotic calcifications again noted along the walls of the tortuous thoracic aorta. Lungs are clear. No pleural effusion seen. No pneumothorax. IMPRESSION: No acute findings. Lungs are clear and there is no evidence of acute cardiopulmonary abnormality. Chronic-appearing osseous findings detailed above. Electronically Signed   By: Franki Cabot M.D.   On: 10/25/2015 11:44   Ct Head Wo Contrast  10/25/2015  CLINICAL DATA:  Fall EXAM: CT HEAD WITHOUT CONTRAST CT CERVICAL SPINE WITHOUT CONTRAST TECHNIQUE: Multidetector CT imaging of the head and cervical spine was performed following the standard protocol without intravenous contrast. Multiplanar CT image reconstructions of the cervical spine were also generated. COMPARISON:  10/10/2015 FINDINGS: CT HEAD FINDINGS Global atrophy.  Chronic ischemic changes in the periventricular white matter. No mass effect, midline shift, or acute intracranial hemorrhage. There is trace fluid in the right mastoid air cells. Left mastoidectomy has been performed. No evidence of skull base or cranial fracture. CT CERVICAL SPINE FINDINGS The chronic fracture through the neck of the odontoid is stable. There is persistent and stable retrolisthesis of the odontoid with respect to the C2 body. The posterior arch of C1 protrudes through the foramen magnum and this results then consider oral narrowing of the central canal. Again, this is a stable finding. Osteopenia persists. Anterolisthesis C4 upon C5 is stable. Reversal of cervical lordosis is also stable. There is no obvious soft tissue injury. No obvious acute fracture line is present. IMPRESSION: There is a small amount of fluid in the right mastoid air cells but otherwise no evidence of acute intracranial pathology. This is a stable finding. Chronic odontoid neck fracture is stable. No acute fracture in the cervical spine. Electronically  Signed   By: Marybelle Killings M.D.   On: 10/25/2015 11:51   Ct Cervical Spine Wo Contrast  10/25/2015  CLINICAL DATA:  Fall EXAM: CT HEAD WITHOUT CONTRAST CT CERVICAL SPINE WITHOUT CONTRAST TECHNIQUE: Multidetector CT imaging of the head and cervical spine was performed following the standard protocol without intravenous contrast. Multiplanar CT image reconstructions of the cervical spine were also generated. COMPARISON:  10/10/2015 FINDINGS: CT HEAD FINDINGS Global atrophy. Chronic ischemic changes in the periventricular white matter. No mass effect, midline shift, or acute intracranial hemorrhage. There is trace fluid in the right mastoid air cells. Left mastoidectomy has been performed. No evidence of skull base or cranial fracture. CT CERVICAL SPINE FINDINGS The chronic fracture through the neck of the odontoid is stable. There is persistent and stable retrolisthesis of the odontoid with respect to the C2 body. The posterior arch of C1 protrudes through the foramen magnum and this results then consider oral narrowing of the central canal. Again, this is a stable finding. Osteopenia persists. Anterolisthesis C4 upon C5 is stable. Reversal of cervical lordosis is also stable. There is no obvious soft tissue injury. No obvious acute fracture line is present. IMPRESSION: There is a small amount of fluid in the right mastoid air cells but otherwise no evidence of acute intracranial pathology. This is a stable finding. Chronic odontoid neck fracture is stable. No acute fracture in the cervical spine. Electronically Signed   By: Marybelle Killings M.D.   On: 10/25/2015 11:51   Dg Hip Unilat With Pelvis 2-3 Views Right  10/25/2015  CLINICAL DATA:  Pain following fall earlier today EXAM: DG HIP (WITH OR WITHOUT PELVIS) 2-3V RIGHT COMPARISON:  June 10, 2013 FINDINGS: Frontal pelvis as well as frontal and lateral right hip images were obtained. There is a comminuted intertrochanteric femur fracture on the right with marked  varus angulation at the fracture site. No other acute fractures are evident. There are old fractures of each superior pubic ramus and ischium with remodeling. No dislocation. There is mild narrowing of both hip joints. Bones are diffusely osteoporotic. There is extensive arterial vascular calcification at multiple sites. IMPRESSION: Comminuted intertrochanteric femur fracture on the right with marked varus angulation at the fracture site. Old fractures of the superior and inferior pubic rami with remodeling. No dislocation. Diffuse osteoporosis. Extensive arterial vascular calcification present. Electronically Signed   By: Lowella Grip III M.D.   On: 10/25/2015 11:41   I have personally reviewed and evaluated these images and lab results as part  of my medical decision-making.   EKG Interpretation   Date/Time:  Thursday October 25 2015 11:46:55 EST Ventricular Rate:  80 PR Interval:  246 QRS Duration: 90 QT Interval:  418 QTC Calculation: 482 R Axis:   41 Text Interpretation:  Sinus rhythm Prolonged PR interval No significant  change since last tracing Confirmed by Jacarri Gesner MD, Andee Poles [phone]) on  10/25/2015 12:20:33 PM      MDM   Final diagnoses:  Fall  Closed right hip fracture, initial encounter ([place])  Dementia, without behavioral disturbance  Leukocytosis  Multiple skin tears    I personally performed the services described in this documentation, which was scribed in my presence. The recorded information has been reviewed and considered. 82y.o. Female with history of dementia , cervical fx treated nonoperatively and recurrent falls presents today after fall with right hip fracture.  Initially borderline hypotensive, some skin tears but no other definite injury.  Plan consult ortho and medicine to admit.  Discussed with Dr. Aline Brochure. He reviewed x-rays and feel that this will require surgery and, as he'll be out of town, patient be transferred Discussed with Dr. Caryn Section, on-call for  hospitalist here. She will do admission with patient to be transferred to [facility]. She requested that I consult orthopedics at [facility]. I spoke with Dr. Jodi Mourning on-call ortho nurse [facility].  Patient npo and femur films added.   Pattricia Boss, MD 10/25/15 1430

## 2015-10-25 NOTE — Progress Notes (Signed)
Pt arrived around 6:44 pm vitals taken B/P85/56, pulse-61 Respirations-18, pt laying comfortable on left side with soft collar applied due to a fall a few months ago pt is only alert to self and looking for her son [name]

## 2015-10-26 ENCOUNTER — Encounter (HOSPITAL_COMMUNITY): Payer: Self-pay | Admitting: Certified Registered Nurse Anesthetist

## 2015-10-26 LAB — URINE CULTURE: Culture: NO GROWTH

## 2015-10-26 LAB — CBC
HCT: 26.9 % — ABNORMAL LOW (ref 36.0–46.0)
HEMOGLOBIN: 9.4 g/dL — AB (ref 12.0–15.0)
MCH: 33.7 pg (ref 26.0–34.0)
MCHC: 34.9 g/dL (ref 30.0–36.0)
MCV: 96.4 fL (ref 78.0–100.0)
PLATELETS: 198 10*3/uL (ref 150–400)
RBC: 2.79 MIL/uL — AB (ref 3.87–5.11)
RDW: 13.7 % (ref 11.5–15.5)
WBC: 13.9 10*3/uL — ABNORMAL HIGH (ref 4.0–10.5)

## 2015-10-26 LAB — GLUCOSE, CAPILLARY: Glucose-Capillary: 145 mg/dL — ABNORMAL HIGH (ref 65–99)

## 2015-10-26 LAB — BASIC METABOLIC PANEL
Anion gap: 14 (ref 5–15)
BUN: 31 mg/dL — AB (ref 6–20)
CHLORIDE: 99 mmol/L — AB (ref 101–111)
CO2: 23 mmol/L (ref 22–32)
CREATININE: 1.64 mg/dL — AB (ref 0.44–1.00)
Calcium: 8.5 mg/dL — ABNORMAL LOW (ref 8.9–10.3)
GFR, EST AFRICAN AMERICAN: 30 mL/min — AB (ref >60)
GFR, EST NON AFRICAN AMERICAN: 26 mL/min — AB (ref >60)
Glucose, Bld: 165 mg/dL — ABNORMAL HIGH (ref 65–99)
Potassium: 4.3 mmol/L (ref 3.5–5.1)
SODIUM: 136 mmol/L (ref 135–145)

## 2015-10-26 LAB — HEMOGLOBIN A1C
Hgb A1c MFr Bld: 5.5 % (ref 4.8–5.6)
Mean Plasma Glucose: 111 mg/dL

## 2015-10-26 LAB — SURGICAL PCR SCREEN
MRSA, PCR: NEGATIVE
STAPHYLOCOCCUS AUREUS: POSITIVE — AB

## 2015-10-26 MED ORDER — FENTANYL CITRATE (PF) 250 MCG/5ML IJ SOLN
INTRAMUSCULAR | Status: AC
  Filled 2015-10-26: qty 5

## 2015-10-26 MED ORDER — PROPOFOL 10 MG/ML IV BOLUS
INTRAVENOUS | Status: AC
  Filled 2015-10-26: qty 20

## 2015-10-26 MED ORDER — ROCURONIUM BROMIDE 50 MG/5ML IV SOLN
INTRAVENOUS | Status: AC
  Filled 2015-10-26: qty 1

## 2015-10-26 MED ORDER — MUPIROCIN 2 % EX OINT
TOPICAL_OINTMENT | CUTANEOUS | Status: AC
  Administered 2015-10-26: 16:00:00
  Filled 2015-10-26: qty 22

## 2015-10-26 MED ORDER — PNEUMOCOCCAL VAC POLYVALENT 25 MCG/0.5ML IJ INJ
0.5000 mL | INJECTION | INTRAMUSCULAR | Status: AC
  Administered 2015-10-29: 0.5 mL via INTRAMUSCULAR
  Filled 2015-10-26: qty 0.5

## 2015-10-26 MED ORDER — LACTATED RINGERS IV SOLN
INTRAVENOUS | Status: DC
  Administered 2015-10-26 – 2015-10-28 (×3): via INTRAVENOUS

## 2015-10-26 MED ORDER — ONDANSETRON HCL 4 MG/2ML IJ SOLN
INTRAMUSCULAR | Status: AC
  Filled 2015-10-26: qty 2

## 2015-10-26 MED ORDER — CEFAZOLIN SODIUM-DEXTROSE 2-3 GM-% IV SOLR
2.0000 g | INTRAVENOUS | Status: DC
  Administered 2015-10-28: 2 g via INTRAVENOUS
  Filled 2015-10-26: qty 50

## 2015-10-26 NOTE — Progress Notes (Signed)
[facility]                                                                              Patient Demographics  Barbara Snyder, is a 82 y.o. female, DOB - [birth year], [id number]  Admit date - 10/25/2015   Admitting Physician Rexene Alberts, MD  Outpatient Primary MD for the patient is Asencion Noble, MD  LOS - 1   Chief Complaint  Patient presents with  . Fall      Interim history 82 year old female with history of CHF, coronary artery disease, spinal stenosis, multiple falls present and with fall and subsequent right hip pain. Patient found an old C2 fracture and currently an Aspen collar. Patient was transferred from [facility]. Orthopedic surgery consulted, plan for surgery this afternoon.  Assessment & Plan   Right intertrochanteric femur fracture status post fall -X-ray of the right hip showed comminuted intertrochanteric femur fracture on the right with marked varus angulation -Orthopedic surgery consulted and appreciated -Plan for surgery later this afternoon -Given patient's age as well as comorbidities, she is high risk for surgery -PT and OT will need to be consulted after patient's surgery -Continue pain control  History of C2 fracture -Occurred in 2016 -Currently an Aspen collar  Leukocytosis -Likely reactive -UA and chest x-ray unremarkable -Pending repeat CBC this morning  Hyperglycemia -Hemoglobin A1c 5.5 -Will continue to monitor CBGs  Alzheimer's dementia -Currently on hold all medications will continue to monitor closely   History of CHF -Echocardiogram 12/03/2009 showed an EF of 75%, no regional wall motion abnormalities -Currently euvolemic, continue to monitor closely -Monitor intake and output, daily weight -Lasix and spironolactone held -Continue metoprolol  Code Status: DNR  Family Communication: none at bedside  Disposition Plan: Admitted,pending surgery today  Time Spent in minutes   30 minutes  Procedures   None  Consults   Orthopedic surgery  DVT Prophylaxis  SCDs  Lab Results  Component Value Date   PLT 272 10/25/2015    Medications  Scheduled Meds: .  ceFAZolin (ANCEF) IV  2 g Intravenous To SS-Surg  . metoprolol  2.5 mg Intravenous 4 times per day   Continuous Infusions: . dextrose 5 % and 0.9% NaCl 70 mL/hr (10/26/15 0540)   PRN Meds:.acetaminophen **OR** acetaminophen, albuterol, morphine injection, ondansetron **OR** ondansetron (ZOFRAN) IV  Antibiotics    Anti-infectives    Start     Dose/Rate Route Frequency Ordered Stop   10/26/15 1800  ceFAZolin (ANCEF) IVPB 2 g/50 mL premix     2 g 100 mL/hr over 30 Minutes Intravenous To ShortStay Surgical 10/26/15 0805 10/27/15 1800      Subjective:   Barbara Snyder seen and examined today.  Patient demented. Does not know why she is in the hospital.  Objective:   Filed Vitals:   10/25/15 1500 10/25/15 1530 10/25/15 2029 10/26/15 0701  BP: 111/63 106/81 156/85 102/67  Pulse: 85 89 116 99  Temp:   97.8 F (36.6 C) 98.2 F (36.8 C)  TempSrc:   Oral Oral  Resp: 25 26 18 16   Weight:    34.2 kg (75 lb 6.4 oz)  SpO2: 96% 96% 98% 93%    Wt Readings from  Last 3 Encounters:  10/26/15 34.2 kg (75 lb 6.4 oz)  10/10/15 34.02 kg (75 lb)  08/14/15 68.04 kg (150 lb)     Intake/Output Summary (Last 24 hours) at 10/26/15 1044 Last data filed at 10/26/15 0702  Gross per 24 hour  Intake      0 ml  Output    200 ml  Net   -200 ml    Exam  General: Well developed,  Elderly, thin, NAD  HEENT: NCAT,mucous membranes moist.   Cardiovascular: S1 S2 auscultated,  2/6 SEM, RRR  Respiratory: Clear to auscultation bilaterally with equal chest rise  Abdomen: Soft, nontender, nondistended, + bowel sounds  Extremities: warm dry without cyanosis clubbing or edema  Neuro: AAOx1, nonfocal, able to follow some commands  Skin: various skin tears, and bruising/ecchymosis on upper/lower ext  Data Review   Micro  Results No results found for this or any previous visit (from the past 240 hour(s)).  Radiology Reports Dg Chest 1 View  10/25/2015  CLINICAL DATA:  Right hip pain status post fall this morning. Alzheimer's. Currently unable to walk. EXAM: CHEST 1 VIEW COMPARISON:  Chest x-rays dated 08/14/2015 and 04/15/2015. FINDINGS: The severe dextroscoliosis of the thoracic spine appears stable. Multiple chronic-appearing compression fracture deformities are again seen within the thoracic spine, difficult to definitively characterize due to the scoliosis and diffuse osteopenia, but all appear chronic. No acute-appearing osseous abnormality seen. Cardiomediastinal silhouette is stable in size and configuration. Extensive atherosclerotic calcifications again noted along the walls of the tortuous thoracic aorta. Lungs are clear. No pleural effusion seen. No pneumothorax. IMPRESSION: No acute findings. Lungs are clear and there is no evidence of acute cardiopulmonary abnormality. Chronic-appearing osseous findings detailed above. Electronically Signed   By: Franki Cabot M.D.   On: 10/25/2015 11:44   Dg Elbow Complete Left  10/10/2015  CLINICAL DATA:  82 year old female status post unwitnessed fall with left elbow aspiration EXAM: LEFT ELBOW - COMPLETE 3+ VIEW COMPARISON:  Concurrently obtained radiographs of the left Knee FINDINGS: No acute fracture or malalignment. No evidence of elbow joint effusion. Irregularity of the soft tissues posterior to the proximal forearm consistent with skin tear/ laceration. The bones are mildly osteopenic in appearance. IMPRESSION: No acute fracture or malalignment. Electronically Signed   By: Jacqulynn Cadet M.D.   On: 10/10/2015 20:27   Ct Head Wo Contrast  10/25/2015  CLINICAL DATA:  Fall EXAM: CT HEAD WITHOUT CONTRAST CT CERVICAL SPINE WITHOUT CONTRAST TECHNIQUE: Multidetector CT imaging of the head and cervical spine was performed following the standard protocol without intravenous  contrast. Multiplanar CT image reconstructions of the cervical spine were also generated. COMPARISON:  10/10/2015 FINDINGS: CT HEAD FINDINGS Global atrophy. Chronic ischemic changes in the periventricular white matter. No mass effect, midline shift, or acute intracranial hemorrhage. There is trace fluid in the right mastoid air cells. Left mastoidectomy has been performed. No evidence of skull base or cranial fracture. CT CERVICAL SPINE FINDINGS The chronic fracture through the neck of the odontoid is stable. There is persistent and stable retrolisthesis of the odontoid with respect to the C2 body. The posterior arch of C1 protrudes through the foramen magnum and this results then consider oral narrowing of the central canal. Again, this is a stable finding. Osteopenia persists. Anterolisthesis C4 upon C5 is stable. Reversal of cervical lordosis is also stable. There is no obvious soft tissue injury. No obvious acute fracture line is present. IMPRESSION: There is a small amount of fluid in the right  mastoid air cells but otherwise no evidence of acute intracranial pathology. This is a stable finding. Chronic odontoid neck fracture is stable. No acute fracture in the cervical spine. Electronically Signed   By: Marybelle Killings M.D.   On: 10/25/2015 11:51   Ct Head Wo Contrast  10/10/2015  CLINICAL DATA:  pt was found in the hallway after falling. Amount of time patient was on the ground is unknown; neck pain; hx cervical fx EXAM: CT HEAD WITHOUT CONTRAST CT CERVICAL SPINE WITHOUT CONTRAST TECHNIQUE: Multidetector CT imaging of the head and cervical spine was performed following the standard protocol without intravenous contrast. Multiplanar CT image reconstructions of the cervical spine were also generated. COMPARISON:  08/13/2015 FINDINGS: CT HEAD FINDINGS The ventricles are normal configuration. There is ventricular and sulcal enlargement reflecting moderate atrophy, stable from the prior study. There are no  parenchymal masses or mass effect. There is no evidence of a cortical infarct. Patchy white matter hypoattenuation is noted bilaterally consistent with moderate chronic microvascular ischemic change, also stable There are no extra-axial masses or abnormal fluid collections. There is no intracranial hemorrhage. No skull fracture.  Sinuses and mastoid air cells are clear. CT CERVICAL SPINE FINDINGS Chronic CT fracture crossing the base of the odontoid, unchanged from prior study. No new fractures. Moderate to marked loss of disc height throughout the cervical spine. Milder facet degenerative changes. Grade 1 anterolisthesis of C4 on C5. These findings are stable. No soft tissue masses or adenopathy. There are carotid vascular calcifications. Lung apices show mild interstitial thickening and scarring, chronic. IMPRESSION: HEAD CT: No acute intracranial abnormalities. No skull fracture. Moderate atrophy and chronic microvascular ischemic change stable from the prior study. CERVICAL CT: No acute fracture. Chronic C2 fracture and chronic degenerative changes. Electronically Signed   By: Lajean Manes M.D.   On: 10/10/2015 20:29   Ct Cervical Spine Wo Contrast  10/25/2015  CLINICAL DATA:  Fall EXAM: CT HEAD WITHOUT CONTRAST CT CERVICAL SPINE WITHOUT CONTRAST TECHNIQUE: Multidetector CT imaging of the head and cervical spine was performed following the standard protocol without intravenous contrast. Multiplanar CT image reconstructions of the cervical spine were also generated. COMPARISON:  10/10/2015 FINDINGS: CT HEAD FINDINGS Global atrophy. Chronic ischemic changes in the periventricular white matter. No mass effect, midline shift, or acute intracranial hemorrhage. There is trace fluid in the right mastoid air cells. Left mastoidectomy has been performed. No evidence of skull base or cranial fracture. CT CERVICAL SPINE FINDINGS The chronic fracture through the neck of the odontoid is stable. There is persistent and  stable retrolisthesis of the odontoid with respect to the C2 body. The posterior arch of C1 protrudes through the foramen magnum and this results then consider oral narrowing of the central canal. Again, this is a stable finding. Osteopenia persists. Anterolisthesis C4 upon C5 is stable. Reversal of cervical lordosis is also stable. There is no obvious soft tissue injury. No obvious acute fracture line is present. IMPRESSION: There is a small amount of fluid in the right mastoid air cells but otherwise no evidence of acute intracranial pathology. This is a stable finding. Chronic odontoid neck fracture is stable. No acute fracture in the cervical spine. Electronically Signed   By: Marybelle Killings M.D.   On: 10/25/2015 11:51   Ct Cervical Spine Wo Contrast  10/10/2015  CLINICAL DATA:  pt was found in the hallway after falling. Amount of time patient was on the ground is unknown; neck pain; hx cervical fx EXAM: CT HEAD WITHOUT  CONTRAST CT CERVICAL SPINE WITHOUT CONTRAST TECHNIQUE: Multidetector CT imaging of the head and cervical spine was performed following the standard protocol without intravenous contrast. Multiplanar CT image reconstructions of the cervical spine were also generated. COMPARISON:  08/13/2015 FINDINGS: CT HEAD FINDINGS The ventricles are normal configuration. There is ventricular and sulcal enlargement reflecting moderate atrophy, stable from the prior study. There are no parenchymal masses or mass effect. There is no evidence of a cortical infarct. Patchy white matter hypoattenuation is noted bilaterally consistent with moderate chronic microvascular ischemic change, also stable There are no extra-axial masses or abnormal fluid collections. There is no intracranial hemorrhage. No skull fracture.  Sinuses and mastoid air cells are clear. CT CERVICAL SPINE FINDINGS Chronic CT fracture crossing the base of the odontoid, unchanged from prior study. No new fractures. Moderate to marked loss of disc  height throughout the cervical spine. Milder facet degenerative changes. Grade 1 anterolisthesis of C4 on C5. These findings are stable. No soft tissue masses or adenopathy. There are carotid vascular calcifications. Lung apices show mild interstitial thickening and scarring, chronic. IMPRESSION: HEAD CT: No acute intracranial abnormalities. No skull fracture. Moderate atrophy and chronic microvascular ischemic change stable from the prior study. CERVICAL CT: No acute fracture. Chronic C2 fracture and chronic degenerative changes. Electronically Signed   By: Lajean Manes M.D.   On: 10/10/2015 20:29   Dg Knee Complete 4 Views Left  10/10/2015  CLINICAL DATA:  82 year old female with fall and left knee pain. EXAM: LEFT KNEE - COMPLETE 4+ VIEW COMPARISON:  None. FINDINGS: There is no acute fracture or dislocation. Osteopenia. There is narrowing of the knee compartment compatible with chronic and osteoarthritic changes. No joint effusion. The soft tissues appear unremarkable. IMPRESSION: No acute/ traumatic osseous pathology. Electronically Signed   By: Anner Crete M.D.   On: 10/10/2015 20:29   Dg Hip Unilat With Pelvis 2-3 Views Right  10/25/2015  CLINICAL DATA:  Pain following fall earlier today EXAM: DG HIP (WITH OR WITHOUT PELVIS) 2-3V RIGHT COMPARISON:  June 10, 2013 FINDINGS: Frontal pelvis as well as frontal and lateral right hip images were obtained. There is a comminuted intertrochanteric femur fracture on the right with marked varus angulation at the fracture site. No other acute fractures are evident. There are old fractures of each superior pubic ramus and ischium with remodeling. No dislocation. There is mild narrowing of both hip joints. Bones are diffusely osteoporotic. There is extensive arterial vascular calcification at multiple sites. IMPRESSION: Comminuted intertrochanteric femur fracture on the right with marked varus angulation at the fracture site. Old fractures of the superior and  inferior pubic rami with remodeling. No dislocation. Diffuse osteoporosis. Extensive arterial vascular calcification present. Electronically Signed   By: Lowella Grip III M.D.   On: 10/25/2015 11:41   Dg Femur, Min 2 Views Right  10/25/2015  CLINICAL DATA:  False morning with right hip pain with known intratrochanteric femoral fracture, initial encounter EXAM: RIGHT FEMUR 2 VIEWS COMPARISON:  None. FINDINGS: Distal femur and knee joint appear within normal limits. The examination is somewhat limited secondary to the patient's inability to position herself. IMPRESSION: No acute abnormality is noted in the distal femur. Electronically Signed   By: Inez Catalina M.D.   On: 10/25/2015 15:12    CBC  Recent Labs Lab 10/25/15 1155  WBC 17.3*  HGB 11.2*  HCT 33.0*  PLT 272  MCV 98.8  MCH 33.5  MCHC 33.9  RDW 13.4  LYMPHSABS 0.8  MONOABS 0.9  EOSABS 0.0  BASOSABS 0.0    Chemistries   Recent Labs Lab 10/25/15 1155  NA 134*  K 4.5  CL 95*  CO2 30  GLUCOSE 175*  BUN 17  CREATININE 0.74  CALCIUM 8.7*  AST 30  ALT 15  ALKPHOS 90  BILITOT 0.7   ------------------------------------------------------------------------------------------------------------------ estimated creatinine clearance is 23.2 mL/min (by C-G formula based on Cr of 0.74). ------------------------------------------------------------------------------------------------------------------  Recent Labs  10/25/15 1155  HGBA1C 5.5   ------------------------------------------------------------------------------------------------------------------ No results for input(s): CHOL, HDL, LDLCALC, TRIG, CHOLHDL, LDLDIRECT in the last 72 hours. ------------------------------------------------------------------------------------------------------------------ No results for input(s): TSH, T4TOTAL, T3FREE, THYROIDAB in the last 72 hours.  Invalid input(s):  FREET3 ------------------------------------------------------------------------------------------------------------------ No results for input(s): VITAMINB12, FOLATE, FERRITIN, TIBC, IRON, RETICCTPCT in the last 72 hours.  Coagulation profile No results for input(s): INR, PROTIME in the last 168 hours.  No results for input(s): DDIMER in the last 72 hours.  Cardiac Enzymes No results for input(s): CKMB, TROPONINI, MYOGLOBIN in the last 168 hours.  Invalid input(s): CK ------------------------------------------------------------------------------------------------------------------ Invalid input(s): POCBNP    Miller Edgington D.O. on 10/26/2015 at 10:44 AM  Between 7am to 7pm - Pager - [phone]  After 7pm go to [email or web address] - password [email or web address]  And look for the night coverage person covering for me after hours  [facility] Office  [phone]

## 2015-10-26 NOTE — Progress Notes (Signed)
Anesthesia cancelled surgery due to wanting preop cardiology eval and repeat echo. Will reschedule when completed.

## 2015-10-26 NOTE — H&P (View-Only) (Signed)
Barbara Snyder is an 82 y.o. female.    Chief Complaint: right hip pain  HPI: 82 y/o female fell earlier today onto right hip while trying to go from bedside commode back to her bed. Currently pt is alert and awake but not aware of date or time. C/o moderate right hip pain and inability to bear weight to right lower extremity. POA Robert is pt's caregiver but currently not present after pt transferred from [place]. Hx of old C2 fracture and pt currently in Aspen collar. Normal ambulation and activity level unable to be assessed.  PCP:  FAGAN,ROY, MD  PMH: Past Medical History  Diagnosis Date  . Alzheimer disease   . Hypertension   . CHF (congestive heart failure) (HCC)   . Arthritis   . Thyroid disease   . Myocardial infarct (HCC) 2001  . S/P colonoscopy Apr 2004    left-sided diverticula, internal hemorrhoids (RMR)  . Hip pain   . Skin rash   . Cholesterol blood decreased   . Trouble in sleeping   . Anxiety   . Coronary artery disease   . Abdominal aortic aneurysm (HCC)   . Frequent falls   . Closed C2 fracture (HCC) 03/2015  . Hematochezia 06/16/2011  . SPINAL STENOSIS 01/14/2010    Qualifier: Diagnosis of  By: Harrison MD, Stanley    . Hypertrophic cardiomyopathy (HCC) 2011    Per echo 2011    PSH: Past Surgical History  Procedure Laterality Date  . Mastoidectomy    . Colonoscopy  12/20/02    internal hemorrhoids otherwise normal/left-side diverticula  . Colonoscopy  10/16/2011    Procedure: COLONOSCOPY;  Surgeon: Robert M Rourk, MD;  Location: [facility];  Service: Endoscopy;  Laterality: N/A;  10:00- pts family request this time     Social History:  reports that she has never smoked. She has never used smokeless tobacco. She reports that she does not drink alcohol or use illicit drugs.  Allergies:  No Known Allergies  Medications: Current Facility-Administered Medications  Medication Dose Route Frequency Provider Last Rate Last Dose  . acetaminophen  (TYLENOL) tablet 650 mg  650 mg Oral Q6H PRN Denise Fisher, MD       Or  . acetaminophen (TYLENOL) suppository 650 mg  650 mg Rectal Q6H PRN Denise Fisher, MD      . albuterol (PROVENTIL) (2.5 MG/3ML) 0.083% nebulizer solution 2.5 mg  2.5 mg Nebulization Q2H PRN Denise Fisher, MD      . dextrose 5 %-0.9 % sodium chloride infusion   Intravenous Continuous Denise Fisher, MD      . metoprolol (LOPRESSOR) injection 2.5 mg  2.5 mg Intravenous 4 times per day Denise Fisher, MD      . morphine 2 MG/ML injection 1 mg  1 mg Intravenous Q3H PRN Denise Fisher, MD      . ondansetron (ZOFRAN) tablet 4 mg  4 mg Oral Q6H PRN Denise Fisher, MD       Or  . ondansetron (ZOFRAN) injection 4 mg  4 mg Intravenous Q6H PRN Denise Fisher, MD        Results for orders placed or performed during the hospital encounter of 10/25/15 (from the past 48 hour(s))  CBC with Differential/Platelet     Status: Abnormal   Collection Time: 10/25/15 11:55 AM  Result Value Ref Range   WBC 17.3 (H) 4.0 - 10.5 K/uL   RBC 3.34 (L) 3.87 - 5.11 MIL/uL   Hemoglobin 11.2 (L) 12.0 -   15.0 g/dL   HCT 33.0 (L) 36.0 - 46.0 %   MCV 98.8 78.0 - 100.0 fL   MCH 33.5 26.0 - 34.0 pg   MCHC 33.9 30.0 - 36.0 g/dL   RDW 13.4 11.5 - 15.5 %   Platelets 272 150 - 400 K/uL   Neutrophils Relative % 90 %   Neutro Abs 15.6 (H) 1.7 - 7.7 K/uL   Lymphocytes Relative 5 %   Lymphs Abs 0.8 0.7 - 4.0 K/uL   Monocytes Relative 5 %   Monocytes Absolute 0.9 0.1 - 1.0 K/uL   Eosinophils Relative 0 %   Eosinophils Absolute 0.0 0.0 - 0.7 K/uL   Basophils Relative 0 %   Basophils Absolute 0.0 0.0 - 0.1 K/uL  Comprehensive metabolic panel     Status: Abnormal   Collection Time: 10/25/15 11:55 AM  Result Value Ref Range   Sodium 134 (L) 135 - 145 mmol/L   Potassium 4.5 3.5 - 5.1 mmol/L   Chloride 95 (L) 101 - 111 mmol/L   CO2 30 22 - 32 mmol/L   Glucose, Bld 175 (H) 65 - 99 mg/dL   BUN 17 6 - 20 mg/dL   Creatinine, Ser 0.74 0.44 - 1.00 mg/dL   Calcium  8.7 (L) 8.9 - 10.3 mg/dL   Total Protein 6.5 6.5 - 8.1 g/dL   Albumin 3.3 (L) 3.5 - 5.0 g/dL   AST 30 15 - 41 U/L   ALT 15 14 - 54 U/L   Alkaline Phosphatase 90 38 - 126 U/L   Total Bilirubin 0.7 0.3 - 1.2 mg/dL   GFR calc non Af Amer >60 >60 mL/min   GFR calc Af Amer >60 >60 mL/min    Comment: (NOTE) The eGFR has been calculated using the CKD EPI equation. This calculation has not been validated in all clinical situations. eGFR's persistently <60 mL/min signify possible Chronic Kidney Disease.    Anion gap 9 5 - 15  Urinalysis, Routine w reflex microscopic (not at [facility])     Status: Abnormal   Collection Time: 10/25/15 12:15 PM  Result Value Ref Range   Color, Urine YELLOW YELLOW   APPearance CLEAR CLEAR   Specific Gravity, Urine 1.025 1.005 - 1.030   pH 5.5 5.0 - 8.0   Glucose, UA NEGATIVE NEGATIVE mg/dL   Hgb urine dipstick TRACE (A) NEGATIVE   Bilirubin Urine NEGATIVE NEGATIVE   Ketones, ur NEGATIVE NEGATIVE mg/dL   Protein, ur NEGATIVE NEGATIVE mg/dL   Nitrite NEGATIVE NEGATIVE   Leukocytes, UA NEGATIVE NEGATIVE  Urine microscopic-add on     Status: Abnormal   Collection Time: 10/25/15 12:15 PM  Result Value Ref Range   Squamous Epithelial / LPF 0-5 (A) NONE SEEN   WBC, UA NONE SEEN 0 - 5 WBC/hpf   RBC / HPF 0-5 0 - 5 RBC/hpf   Bacteria, UA NONE SEEN NONE SEEN   Dg Chest 1 View  10/25/2015  CLINICAL DATA:  Right hip pain status post fall this morning. Alzheimer's. Currently unable to walk. EXAM: CHEST 1 VIEW COMPARISON:  Chest x-rays dated 08/14/2015 and 04/15/2015. FINDINGS: The severe dextroscoliosis of the thoracic spine appears stable. Multiple chronic-appearing compression fracture deformities are again seen within the thoracic spine, difficult to definitively characterize due to the scoliosis and diffuse osteopenia, but all appear chronic. No acute-appearing osseous abnormality seen. Cardiomediastinal silhouette is stable in size and configuration. Extensive  atherosclerotic calcifications again noted along the walls of the tortuous thoracic aorta. Lungs are clear. No   pleural effusion seen. No pneumothorax. IMPRESSION: No acute findings. Lungs are clear and there is no evidence of acute cardiopulmonary abnormality. Chronic-appearing osseous findings detailed above. Electronically Signed   By: Stan  Maynard M.D.   On: 10/25/2015 11:44   Ct Head Wo Contrast  10/25/2015  CLINICAL DATA:  Fall EXAM: CT HEAD WITHOUT CONTRAST CT CERVICAL SPINE WITHOUT CONTRAST TECHNIQUE: Multidetector CT imaging of the head and cervical spine was performed following the standard protocol without intravenous contrast. Multiplanar CT image reconstructions of the cervical spine were also generated. COMPARISON:  10/10/2015 FINDINGS: CT HEAD FINDINGS Global atrophy. Chronic ischemic changes in the periventricular white matter. No mass effect, midline shift, or acute intracranial hemorrhage. There is trace fluid in the right mastoid air cells. Left mastoidectomy has been performed. No evidence of skull base or cranial fracture. CT CERVICAL SPINE FINDINGS The chronic fracture through the neck of the odontoid is stable. There is persistent and stable retrolisthesis of the odontoid with respect to the C2 body. The posterior arch of C1 protrudes through the foramen magnum and this results then consider oral narrowing of the central canal. Again, this is a stable finding. Osteopenia persists. Anterolisthesis C4 upon C5 is stable. Reversal of cervical lordosis is also stable. There is no obvious soft tissue injury. No obvious acute fracture line is present. IMPRESSION: There is a small amount of fluid in the right mastoid air cells but otherwise no evidence of acute intracranial pathology. This is a stable finding. Chronic odontoid neck fracture is stable. No acute fracture in the cervical spine. Electronically Signed   By: Arthur  Hoss M.D.   On: 10/25/2015 11:51   Ct Cervical Spine Wo  Contrast  10/25/2015  CLINICAL DATA:  Fall EXAM: CT HEAD WITHOUT CONTRAST CT CERVICAL SPINE WITHOUT CONTRAST TECHNIQUE: Multidetector CT imaging of the head and cervical spine was performed following the standard protocol without intravenous contrast. Multiplanar CT image reconstructions of the cervical spine were also generated. COMPARISON:  10/10/2015 FINDINGS: CT HEAD FINDINGS Global atrophy. Chronic ischemic changes in the periventricular white matter. No mass effect, midline shift, or acute intracranial hemorrhage. There is trace fluid in the right mastoid air cells. Left mastoidectomy has been performed. No evidence of skull base or cranial fracture. CT CERVICAL SPINE FINDINGS The chronic fracture through the neck of the odontoid is stable. There is persistent and stable retrolisthesis of the odontoid with respect to the C2 body. The posterior arch of C1 protrudes through the foramen magnum and this results then consider oral narrowing of the central canal. Again, this is a stable finding. Osteopenia persists. Anterolisthesis C4 upon C5 is stable. Reversal of cervical lordosis is also stable. There is no obvious soft tissue injury. No obvious acute fracture line is present. IMPRESSION: There is a small amount of fluid in the right mastoid air cells but otherwise no evidence of acute intracranial pathology. This is a stable finding. Chronic odontoid neck fracture is stable. No acute fracture in the cervical spine. Electronically Signed   By: Arthur  Hoss M.D.   On: 10/25/2015 11:51   Dg Hip Unilat With Pelvis 2-3 Views Right  10/25/2015  CLINICAL DATA:  Pain following fall earlier today EXAM: DG HIP (WITH OR WITHOUT PELVIS) 2-3V RIGHT COMPARISON:  June 10, 2013 FINDINGS: Frontal pelvis as well as frontal and lateral right hip images were obtained. There is a comminuted intertrochanteric femur fracture on the right with marked varus angulation at the fracture site. No other acute fractures are evident.    There are old fractures of each superior pubic ramus and ischium with remodeling. No dislocation. There is mild narrowing of both hip joints. Bones are diffusely osteoporotic. There is extensive arterial vascular calcification at multiple sites. IMPRESSION: Comminuted intertrochanteric femur fracture on the right with marked varus angulation at the fracture site. Old fractures of the superior and inferior pubic rami with remodeling. No dislocation. Diffuse osteoporosis. Extensive arterial vascular calcification present. Electronically Signed   By: William  Woodruff III M.D.   On: 10/25/2015 11:41   Dg Femur, Min 2 Views Right  10/25/2015  CLINICAL DATA:  False morning with right hip pain with known intratrochanteric femoral fracture, initial encounter EXAM: RIGHT FEMUR 2 VIEWS COMPARISON:  None. FINDINGS: Distal femur and knee joint appear within normal limits. The examination is somewhat limited secondary to the patient's inability to position herself. IMPRESSION: No acute abnormality is noted in the distal femur. Electronically Signed   By: Mark  Lukens M.D.   On: 10/25/2015 15:12    ROS: ROS Hx of multiple falls in the past with skin tears, bruising and previous fractures Currently disoriented to time and place  Physical Exam: Alert but disoriented cachetic 82 y/o female Currently in aspen collar from previous C2 fracture Bilateral upper extremities show several skin tears but good rom No signs of fracture or deformity Currently lying on left side with contracted right leg Extreme bruising to bilateral tibia circumfrentially No signs of open fracture nv intact distally bilateral upper and lower extremities Physical Exam   Assessment/Plan Assessment: right intertroch femur fracture  Plan: Pt admitted by medical team Recommend surgery for right femur to allow for ambulation Will await medical clearance before proceeding with surgery but tentatively planning on surgery tomorrow Pain  management as needed Strict bedrest Will need to speak with POA about consent and surgical procedure  

## 2015-10-26 NOTE — Progress Notes (Signed)
Dr. Lyla Glassing spoke with family. Awaiting transport to take pt back to room.

## 2015-10-26 NOTE — Progress Notes (Signed)
Report called to [facility], RN on 5N, informed that surgery is cancelled. Awaiting Dr. Lyla Glassing to speak with family prior to transporting pt back to room.

## 2015-10-26 NOTE — Interval H&P Note (Signed)
History and Physical Interval Note:  10/26/2015 4:47 PM  Barbara Snyder  has presented today for surgery, with the diagnosis of Right Femur fracture  The various methods of treatment have been discussed with the patient and family. After consideration of risks, benefits and other options for treatment, the patient has consented to  Procedure(s): INTRAMEDULLARY (IM) NAIL INTERTROCHANTRIC (Right) as a surgical intervention .  The patient's history has been reviewed, patient examined, no change in status, stable for surgery.  I have reviewed the patient's chart and labs.  Questions were answered to the patient's satisfaction.    The risks, benefits, and alternatives were discussed with the patient. There are risks associated with the surgery including, but not limited to, problems with anesthesia (death), infection, differences in leg length/angulation/rotation, fracture of bones, loosening or failure of implants, malunion, nonunion, hematoma (blood accumulation) which may require surgical drainage, blood clots, pulmonary embolism, nerve injury (foot drop), and blood vessel injury. The patient understands these risks and elects to proceed.    Medea Deines, Horald Pollen

## 2015-10-26 NOTE — Consult Note (Signed)
WOC wound consult note Reason for Consult: multiple skin tears Wound type: skin tears 2 appear to be older, healing well 2 new sites right upper arm Pressure Ulcer POA: /No Measurement: Left lower lateral: 4cm x 4cm x 0.1cm: clean/pink, moist, serosanguinous drainage, minimal  Right lower medial: 3cm x 1.5cm x 0.1cm clean/pink, moist, serosanguinous drainage, minimal  Right upper posterior: 0.5cm x 0.5cm x 0.2cm clean/pink, moist, sanguinous drainage, minimal    Right upper anterior: 0.2cm x 0.5cm  X 0.2cm clean/pink, moist, sanguinous drainage, minimal  Wound bed: see above Drainage (amount, consistency, odor) see above Periwound:intact, frail, thin skin  Dressing procedure/placement/frequency: Changed to xeroform for antibacterial effects and non adherent, gauze, kerlix, and stockinnette.  No tape. Change every 3 days.    Discussed POC with patient and bedside nurse.  Re consult if needed, will not follow at this time. Thanks  [name], [place] [phone])

## 2015-10-26 NOTE — Progress Notes (Signed)
Orthopedic Tech Progress Note Patient Details:  Barbara Snyder Jun 12, 1920 OF:[id number]  Ortho Devices Type of Ortho Device: Soft collar Ortho Device/Splint Interventions: Application   [place] Pink 10/26/2015, 6:47 PM

## 2015-10-27 ENCOUNTER — Inpatient Hospital Stay (HOSPITAL_COMMUNITY): Payer: Medicare Other

## 2015-10-27 DIAGNOSIS — Z01810 Encounter for preprocedural cardiovascular examination: Secondary | ICD-10-CM

## 2015-10-27 DIAGNOSIS — I10 Essential (primary) hypertension: Secondary | ICD-10-CM

## 2015-10-27 DIAGNOSIS — I2510 Atherosclerotic heart disease of native coronary artery without angina pectoris: Secondary | ICD-10-CM

## 2015-10-27 DIAGNOSIS — R739 Hyperglycemia, unspecified: Secondary | ICD-10-CM

## 2015-10-27 LAB — CBC
HCT: 25.1 % — ABNORMAL LOW (ref 36.0–46.0)
Hemoglobin: 8.3 g/dL — ABNORMAL LOW (ref 12.0–15.0)
MCH: 32.2 pg (ref 26.0–34.0)
MCHC: 33.1 g/dL (ref 30.0–36.0)
MCV: 97.3 fL (ref 78.0–100.0)
PLATELETS: 182 10*3/uL (ref 150–400)
RBC: 2.58 MIL/uL — AB (ref 3.87–5.11)
RDW: 14 % (ref 11.5–15.5)
WBC: 13 10*3/uL — AB (ref 4.0–10.5)

## 2015-10-27 LAB — BASIC METABOLIC PANEL
ANION GAP: 11 (ref 5–15)
BUN: 35 mg/dL — AB (ref 6–20)
CALCIUM: 8.5 mg/dL — AB (ref 8.9–10.3)
CO2: 25 mmol/L (ref 22–32)
Chloride: 104 mmol/L (ref 101–111)
Creatinine, Ser: 1.22 mg/dL — ABNORMAL HIGH (ref 0.44–1.00)
GFR calc Af Amer: 43 mL/min — ABNORMAL LOW (ref >60)
GFR, EST NON AFRICAN AMERICAN: 37 mL/min — AB (ref >60)
Glucose, Bld: 118 mg/dL — ABNORMAL HIGH (ref 65–99)
POTASSIUM: 3.7 mmol/L (ref 3.5–5.1)
SODIUM: 140 mmol/L (ref 135–145)

## 2015-10-27 LAB — MAGNESIUM: Magnesium: 2.4 mg/dL (ref 1.7–2.4)

## 2015-10-27 LAB — TROPONIN I: Troponin I: 0.07 ng/mL — ABNORMAL HIGH (ref <0.031)

## 2015-10-27 MED ORDER — CHLORHEXIDINE GLUCONATE 0.12 % MT SOLN
15.0000 mL | Freq: Two times a day (BID) | OROMUCOSAL | Status: DC
  Administered 2015-10-27 – 2015-10-30 (×5): 15 mL via OROMUCOSAL
  Filled 2015-10-27 (×5): qty 15

## 2015-10-27 MED ORDER — CETYLPYRIDINIUM CHLORIDE 0.05 % MT LIQD
7.0000 mL | Freq: Two times a day (BID) | OROMUCOSAL | Status: DC
  Administered 2015-10-27 – 2015-10-30 (×7): 7 mL via OROMUCOSAL

## 2015-10-27 MED ORDER — LABETALOL HCL 5 MG/ML IV SOLN
5.0000 mg | INTRAVENOUS | Status: DC | PRN
  Filled 2015-10-27: qty 4

## 2015-10-27 NOTE — Progress Notes (Signed)
[facility] PROGRESS NOTE  Barbara Snyder [id number] DOB: 1919-11-16 DOA: 10/25/2015 PCP: Asencion Noble, MD  HPI/Brief narrative 82 year old female with history of CHF, coronary artery disease, spinal stenosis, multiple falls present and with fall and subsequent right hip pain. Patient found an old C2 fracture and currently an Aspen collar. Patient was transferred from [facility]. Orthopedic surgery consulted, plan for surgery this afternoon.  Assessment/Plan: Right intertrochanteric femur fracture status post fall -X-ray of the right hip showed comminuted intertrochanteric femur fracture on the right with marked varus angulation -Orthopedic surgery consulted -Given patient's age as well as comorbidities, she is high risk for surgery -Pt initially planned for surgery today, however anesthesia requested formal Cardiology clearance -Appreciate input by Cardiology  History of C2 fracture -Occurred in 2016 -Remains with Aspen collar  Leukocytosis -Likely reactive -UA and chest x-ray unremarkable  Hyperglycemia -Hemoglobin A1c 5.5 -Will continue to monitor CBGs  Alzheimer's dementia -Currently on hold all medications will continue to monitor closely   History of CHF -Echocardiogram 12/03/2009 showed an EF of 75%, no regional wall motion abnormalities -Currently euvolemic, continue to monitor closely -Monitor intake and output, daily weight -On Lasix and spironolactone held -Continue metoprolol as tolerated  Code Status: DNR Family Communication: Pt in room Disposition Plan: Possible SNF after surgery   Consultants:  Orthopedic Surgery  Cardiology  Procedures:    Antibiotics: Anti-infectives    Start     Dose/Rate Route Frequency Ordered Stop   10/26/15 1800  ceFAZolin (ANCEF) IVPB 2 g/50 mL premix  Status:  Discontinued     2 g 100 mL/hr over 30 Minutes Intravenous To ShortStay Surgical 10/26/15 0805 10/26/15 1742      HPI/Subjective: Unable  to discern secondary to dementia  Objective: Filed Vitals:   10/27/15 0503 10/27/15 1345 10/27/15 1413 10/27/15 1426  BP: 99/42 100/60 112/73   Pulse: 117  127   Temp: 98.8 F (37.1 C)  98.8 F (37.1 C)   TempSrc: Oral  Oral   Resp: 18  18   Weight:      SpO2: 91%  84% 92%    Intake/Output Summary (Last 24 hours) at 10/27/15 1711 Last data filed at 10/27/15 0900  Gross per 24 hour  Intake    120 ml  Output    200 ml  Net    -80 ml   Filed Weights   10/25/15 1051 10/26/15 0701 10/27/15 0459  Weight: 29.484 kg (65 lb) 34.2 kg (75 lb 6.4 oz) 31.9 kg (70 lb 5.2 oz)    Exam:   General:  Awake, in nad, confused  Cardiovascular: regular, s1, s2  Respiratory: normal resp effort, no wheezing  Abdomen: soft, nondistended  Musculoskeletal: perfused, no clubbing   Data Reviewed: Basic Metabolic Panel:  Recent Labs Lab 10/25/15 1155 10/26/15 1132 10/27/15 0418  NA 134* 136 140  K 4.5 4.3 3.7  CL 95* 99* 104  CO2 30 23 25   GLUCOSE 175* 165* 118*  BUN 17 31* 35*  CREATININE 0.74 1.64* 1.22*  CALCIUM 8.7* 8.5* 8.5*   Liver Function Tests:  Recent Labs Lab 10/25/15 1155  AST 30  ALT 15  ALKPHOS 90  BILITOT 0.7  PROT 6.5  ALBUMIN 3.3*   No results for input(s): LIPASE, AMYLASE in the last 168 hours. No results for input(s): AMMONIA in the last 168 hours. CBC:  Recent Labs Lab 10/25/15 1155 10/26/15 1132 10/27/15 0418  WBC 17.3* 13.9* 13.0*  NEUTROABS 15.6*  --   --  HGB 11.2* 9.4* 8.3*  HCT 33.0* 26.9* 25.1*  MCV 98.8 96.4 97.3  PLT 272 198 182   Cardiac Enzymes: No results for input(s): CKTOTAL, CKMB, CKMBINDEX, TROPONINI in the last 168 hours. BNP (last 3 results) No results for input(s): BNP in the last 8760 hours.  ProBNP (last 3 results) No results for input(s): PROBNP in the last 8760 hours.  CBG:  Recent Labs Lab 10/26/15 1156  GLUCAP 145*    Recent Results (from the past 240 hour(s))  Urine culture     Status: None    Collection Time: 10/25/15 11:50 AM  Result Value Ref Range Status   Specimen Description URINE, CATHETERIZED  Final   Special Requests NONE  Final   Culture   Final    NO GROWTH 1 DAY Performed at [facility]    Report Status 10/26/2015 FINAL  Final  Surgical pcr screen     Status: Abnormal   Collection Time: 10/26/15 11:28 AM  Result Value Ref Range Status   MRSA, PCR NEGATIVE NEGATIVE Final   Staphylococcus aureus POSITIVE (A) NEGATIVE Final    Comment:        The Xpert SA Assay (FDA approved for NASAL specimens in patients over 54 years of age), is one component of a comprehensive surveillance program.  Test performance has been validated by [facility] for patients greater than or equal to 67 year old. It is not intended to diagnose infection nor to guide or monitor treatment.      Studies: No results found.  Scheduled Meds: . antiseptic oral rinse  7 mL Mouth Rinse q12n4p  . chlorhexidine  15 mL Mouth Rinse BID  . metoprolol  2.5 mg Intravenous 4 times per day  . pneumococcal 23 valent vaccine  0.5 mL Intramuscular Tomorrow-1000   Continuous Infusions: . dextrose 5 % and 0.9% NaCl 70 mL/hr (10/26/15 0540)  . lactated ringers 10 mL/hr at 10/27/15 1359    Principal Problem:   Intertrochanteric fracture of right femur (HCC) Active Problems:   Multiple falls   Multiple skin tears   CAD (coronary artery disease)   Hypertrophic cardiomyopathy ([place])   Essential hypertension   Alzheimer's dementia without behavioral disturbance   Leukocytosis   Hyperglycemia   Leslie Jester, [facility] Pager [phone]. If 7PM-7AM, please contact night-coverage at [email or web address], password [facility] 10/27/2015, 5:11 PM  LOS: 2 days

## 2015-10-27 NOTE — Consult Note (Signed)
Primary cardiologist: Consulting cardiologist:  Clinical Summary Ms. Darras is a 82 y.o.female with history of nonobstructive CAD by cath 2001, spinal stenosis, DNR status, dementia, HTN, admitted with fall and right hip fracture. Cardiology is consulted for preoperative cardiac evaluation. There is mention of CHF in her record however all reviewed echos show normal LVEF, diastolic function has not been well described. Echo 2011 describes asymetric septal hypertrophy suggestive of HOCM, a subvavular gradient is mentioned but value not given.   Patient is a poor historian and is not able to give any clear history   Cr 0.74, BUN 17, K 4.5, Hgb 11.2, WBC 17.3, Plt 272,  CXR no acute process Echo pending EKG SR, 1st degree AV block.    No Known Allergies  Medications Scheduled Medications: . antiseptic oral rinse  7 mL Mouth Rinse q12n4p  . chlorhexidine  15 mL Mouth Rinse BID  . metoprolol  2.5 mg Intravenous 4 times per day  . pneumococcal 23 valent vaccine  0.5 mL Intramuscular Tomorrow-1000     Infusions: . dextrose 5 % and 0.9% NaCl 70 mL/hr (10/26/15 0540)  . lactated ringers 10 mL/hr at 10/26/15 1626     PRN Medications:  acetaminophen **OR** acetaminophen, albuterol, morphine injection, ondansetron **OR** ondansetron (ZOFRAN) IV   Past Medical History  Diagnosis Date  . Alzheimer disease   . Hypertension   . CHF (congestive heart failure) ([place])   . Arthritis   . Thyroid disease   . Myocardial infarct ([place]) 2001  . S/P colonoscopy Apr 2004    left-sided diverticula, internal hemorrhoids (RMR)  . Hip pain   . Skin rash   . Cholesterol blood decreased   . Trouble in sleeping   . Anxiety   . Coronary artery disease   . Abdominal aortic aneurysm ([place])   . Frequent falls   . Closed C2 fracture ([place]) 03/2015  . Hematochezia 06/16/2011  . SPINAL STENOSIS 01/14/2010    Qualifier: Diagnosis of  By: Aline Brochure MD, Dorothyann Peng    . Hypertrophic cardiomyopathy ([place])  2011    Per echo 2011    Past Surgical History  Procedure Laterality Date  . Mastoidectomy    . Colonoscopy  12/20/02    internal hemorrhoids otherwise normal/left-side diverticula  . Colonoscopy  10/16/2011    Procedure: COLONOSCOPY;  Surgeon: Daneil Dolin, MD;  Location: [facility];  Service: Endoscopy;  Laterality: N/A;  10:00- pts family request this time     Family History  Problem Relation Age of Onset  . Colon cancer Sister     Stage II, in remission  . Colon cancer      nephew; Stage IV    Social History Ms. Mcgreevy reports that she has never smoked. She has never used smokeless tobacco. Ms. Gsell reports that she does not drink alcohol.  Review of Systems Unable to collect review of systems, patient is poor historian, does not answer questions coherently.      Physical Examination Blood pressure 99/42, pulse 117, temperature 98.8 F (37.1 C), temperature source Oral, resp. rate 18, weight 70 lb 5.2 oz (31.9 kg), SpO2 91 %.  Intake/Output Summary (Last 24 hours) at 10/27/15 1351 Last data filed at 10/27/15 0900  Gross per 24 hour  Intake    120 ml  Output    200 ml  Net    -80 ml    HEENT: sclera clear, throat clear  Cardiovascular: heart sounds muffled by loud coarse breath sounds  Respiratory:  coarse breath sounds bilaterally  GI: abdomen soft, NT, ND  MSK: no LE edema  Neuro: moves all 4 extremities. Responds to voice but does not answer questions coherently.   Psych: unable to assess, does not answer questions coherently.    Lab Results  Basic Metabolic Panel:  Recent Labs Lab 10/25/15 1155 10/26/15 1132 10/27/15 0418  NA 134* 136 140  K 4.5 4.3 3.7  CL 95* 99* 104  CO2 30 23 25   GLUCOSE 175* 165* 118*  BUN 17 31* 35*  CREATININE 0.74 1.64* 1.22*  CALCIUM 8.7* 8.5* 8.5*    Liver Function Tests:  Recent Labs Lab 10/25/15 1155  AST 30  ALT 15  ALKPHOS 90  BILITOT 0.7  PROT 6.5  ALBUMIN 3.3*    CBC:  Recent  Labs Lab 10/25/15 1155 10/26/15 1132 10/27/15 0418  WBC 17.3* 13.9* 13.0*  NEUTROABS 15.6*  --   --   HGB 11.2* 9.4* 8.3*  HCT 33.0* 26.9* 25.1*  MCV 98.8 96.4 97.3  PLT 272 198 182    Cardiac Enzymes: No results for input(s): CKTOTAL, CKMB, CKMBINDEX, TROPONINI in the last 168 hours.  BNP: Invalid input(s): POCBNP    Imaging Cath 2001 CONCLUSIONS: 1. Nonobstructive coronary artery disease. 2. Mildly reduced left ventricular function, with regional wall motion  abnormalities. 3. Moderate versus moderately severe mitral regurgitation with a decreased  cardiac output.    Impression/Recommendations  1.Preoperative cardiac evaluation - patient is high risk based on her overall age and fragility, unclear at this time how much of this risk would be specifically attributed to her cardiac risk. Unable to gauge exercise tolerance by history as she is not answering questions coherently. Will follow up echo results. In absence of significant cardiac dysfunction by echo her cardiac specific risk itself would likely not be prohibitive. Would defer risk decision based on her dementia, advanced age, and overall fragility to medicine and ortho teams. We will better define her cardiac risk once echo results are in.       Carlyle Dolly, M.D.

## 2015-10-27 NOTE — Progress Notes (Addendum)
Subjective: R hip intertroch fx awaiting surgery for IM nail femur, surgery cancelled yesterday by anesthesia due to hx of aortic stenosis, requesting updated echo. Cardiology has not seen the patient yet.  Objective: Vital signs in last 24 hours: Temp:  [98.6 F (37 C)-99.9 F (37.7 C)] 98.8 F (37.1 C) (03/04 0503) Pulse Rate:  [93-117] 117 (03/04 0503) Resp:  [18] 18 (03/04 0503) BP: (99-136)/(42-70) 99/42 mmHg (03/04 0503) SpO2:  [91 %-93 %] 91 % (03/04 0503) Weight:  [31.9 kg (70 lb 5.2 oz)] 31.9 kg (70 lb 5.2 oz) (03/04 0459)  Intake/Output from previous day: 03/03 0701 - 03/04 0700 In: 0  Out: 400 [Urine:400] Intake/Output this shift: Total I/O In: 120 [P.O.:120] Out: -    Recent Labs  10/25/15 1155 10/26/15 1132 10/27/15 0418  HGB 11.2* 9.4* 8.3*    Recent Labs  10/26/15 1132 10/27/15 0418  WBC 13.9* 13.0*  RBC 2.79* 2.58*  HCT 26.9* 25.1*  PLT 198 182    Recent Labs  10/26/15 1132 10/27/15 0418  NA 136 140  K 4.3 3.7  CL 99* 104  CO2 23 25  BUN 31* 35*  CREATININE 1.64* 1.22*  GLUCOSE 165* 118*  CALCIUM 8.5* 8.5*   No results for input(s): LABPT, INR in the last 72 hours.  Neurologically intact ABD soft Neurovascular intact Sensation intact distally Intact pulses distally Dorsiflexion/Plantar flexion intact No cellulitis present Compartment soft no sign of DVT  Assessment/Plan: Awaiting surgery tomorrow for IM nail R hip pending cardiac eval and echo Remain NPO after MN Cardiac eval and echo to be done today- Dr. Tonita Cong discussed with hospitalist on call Discussed with Dr. Nash Shearer, Conley Rolls. 10/27/2015, 10:44 AM

## 2015-10-27 NOTE — Progress Notes (Signed)
  Echocardiogram 2D Echocardiogram has been performed.  Darlina Sicilian M 10/27/2015, 1:33 PM

## 2015-10-28 ENCOUNTER — Inpatient Hospital Stay (HOSPITAL_COMMUNITY): Payer: Medicare Other

## 2015-10-28 ENCOUNTER — Inpatient Hospital Stay (HOSPITAL_COMMUNITY): Payer: Medicare Other | Admitting: Anesthesiology

## 2015-10-28 ENCOUNTER — Encounter (HOSPITAL_COMMUNITY)
Admission: EM | Disposition: A | Discharge status: Skilled Nursing Facility | Payer: Self-pay | Source: Home / Self Care | Attending: Internal Medicine

## 2015-10-28 ENCOUNTER — Encounter (HOSPITAL_COMMUNITY): Payer: Self-pay | Admitting: Anesthesiology

## 2015-10-28 DIAGNOSIS — G309 Alzheimer's disease, unspecified: Secondary | ICD-10-CM

## 2015-10-28 DIAGNOSIS — S72141A Displaced intertrochanteric fracture of right femur, initial encounter for closed fracture: Principal | ICD-10-CM

## 2015-10-28 DIAGNOSIS — I422 Other hypertrophic cardiomyopathy: Secondary | ICD-10-CM

## 2015-10-28 DIAGNOSIS — S72101A Unspecified trochanteric fracture of right femur, initial encounter for closed fracture: Secondary | ICD-10-CM | POA: Diagnosis present

## 2015-10-28 HISTORY — PX: INTRAMEDULLARY (IM) NAIL INTERTROCHANTERIC: SHX5875

## 2015-10-28 LAB — CBC
HEMATOCRIT: 22.8 % — AB (ref 36.0–46.0)
HEMOGLOBIN: 7.6 g/dL — AB (ref 12.0–15.0)
MCH: 31.8 pg (ref 26.0–34.0)
MCHC: 33.3 g/dL (ref 30.0–36.0)
MCV: 95.4 fL (ref 78.0–100.0)
Platelets: 81 10*3/uL — ABNORMAL LOW (ref 150–400)
RBC: 2.39 MIL/uL — ABNORMAL LOW (ref 3.87–5.11)
RDW: 16.4 % — ABNORMAL HIGH (ref 11.5–15.5)
WBC: 12.3 10*3/uL — ABNORMAL HIGH (ref 4.0–10.5)

## 2015-10-28 LAB — PREPARE RBC (CROSSMATCH)

## 2015-10-28 LAB — ABO/RH: ABO/RH(D): A POS

## 2015-10-28 LAB — TROPONIN I: Troponin I: 0.08 ng/mL — ABNORMAL HIGH (ref <0.031)

## 2015-10-28 SURGERY — FIXATION, FRACTURE, INTERTROCHANTERIC, WITH INTRAMEDULLARY ROD
Anesthesia: General | Site: Leg Upper | Laterality: Right

## 2015-10-28 MED ORDER — PHENYLEPHRINE HCL 10 MG/ML IJ SOLN
10.0000 mg | INTRAVENOUS | Status: DC | PRN
  Administered 2015-10-28: 20 ug/min via INTRAVENOUS
  Administered 2015-10-28: 80 ug/min via INTRAVENOUS
  Administered 2015-10-28: 40 ug/min via INTRAVENOUS

## 2015-10-28 MED ORDER — ACETAMINOPHEN 325 MG PO TABS
650.0000 mg | ORAL_TABLET | Freq: Four times a day (QID) | ORAL | Status: DC | PRN
  Administered 2015-10-29 – 2015-10-30 (×3): 650 mg via ORAL
  Filled 2015-10-28 (×3): qty 2

## 2015-10-28 MED ORDER — SODIUM CHLORIDE 0.9 % IV SOLN
10.0000 mL/h | Freq: Once | INTRAVENOUS | Status: AC
  Administered 2015-10-28: 10:00:00 via INTRAVENOUS

## 2015-10-28 MED ORDER — FENTANYL CITRATE (PF) 100 MCG/2ML IJ SOLN
INTRAMUSCULAR | Status: DC | PRN
  Administered 2015-10-28 (×2): 50 ug via INTRAVENOUS
  Administered 2015-10-28: 100 ug via INTRAVENOUS

## 2015-10-28 MED ORDER — METOCLOPRAMIDE HCL 5 MG PO TABS: 5.0000 mg | ORAL_TABLET | Freq: Three times a day (TID) | ORAL | Status: DC | PRN

## 2015-10-28 MED ORDER — FENTANYL CITRATE (PF) 250 MCG/5ML IJ SOLN
INTRAMUSCULAR | Status: AC
  Filled 2015-10-28: qty 5

## 2015-10-28 MED ORDER — ONDANSETRON HCL 4 MG PO TABS: 4.0000 mg | ORAL_TABLET | Freq: Four times a day (QID) | ORAL | Status: DC | PRN

## 2015-10-28 MED ORDER — PROPOFOL 10 MG/ML IV BOLUS
INTRAVENOUS | Status: AC
  Filled 2015-10-28: qty 20

## 2015-10-28 MED ORDER — ONDANSETRON HCL 4 MG/2ML IJ SOLN: 4.0000 mg | Freq: Four times a day (QID) | INTRAMUSCULAR | Status: DC | PRN

## 2015-10-28 MED ORDER — ETOMIDATE 2 MG/ML IV SOLN
INTRAVENOUS | Status: DC | PRN
  Administered 2015-10-28: 16 mg via INTRAVENOUS

## 2015-10-28 MED ORDER — 0.9 % SODIUM CHLORIDE (POUR BTL) OPTIME
TOPICAL | Status: DC | PRN
  Administered 2015-10-28: 1000 mL

## 2015-10-28 MED ORDER — FENTANYL CITRATE (PF) 100 MCG/2ML IJ SOLN
12.5000 ug | INTRAMUSCULAR | Status: DC | PRN
  Administered 2015-10-28: 25 ug via INTRAVENOUS
  Filled 2015-10-28: qty 2

## 2015-10-28 MED ORDER — PHENYLEPHRINE HCL 10 MG/ML IJ SOLN
INTRAMUSCULAR | Status: DC | PRN
  Administered 2015-10-28: 120 ug via INTRAVENOUS
  Administered 2015-10-28: 80 ug via INTRAVENOUS
  Administered 2015-10-28: 40 ug via INTRAVENOUS
  Administered 2015-10-28: 120 ug via INTRAVENOUS
  Administered 2015-10-28: 80 ug via INTRAVENOUS

## 2015-10-28 MED ORDER — MORPHINE SULFATE (PF) 2 MG/ML IV SOLN: 0.5000 mg | INTRAVENOUS | Status: DC | PRN

## 2015-10-28 MED ORDER — PHENOL 1.4 % MT LIQD: 1.0000 | OROMUCOSAL | Status: DC | PRN

## 2015-10-28 MED ORDER — ALBUMIN HUMAN 5 % IV SOLN
INTRAVENOUS | Status: DC | PRN
  Administered 2015-10-28 (×2): via INTRAVENOUS

## 2015-10-28 MED ORDER — MENTHOL 3 MG MT LOZG
1.0000 | LOZENGE | OROMUCOSAL | Status: DC | PRN
  Filled 2015-10-28: qty 9

## 2015-10-28 MED ORDER — HYDROCODONE-ACETAMINOPHEN 5-325 MG PO TABS: 1.0000 | ORAL_TABLET | Freq: Four times a day (QID) | ORAL | Status: DC | PRN

## 2015-10-28 MED ORDER — LIDOCAINE HCL (CARDIAC) 20 MG/ML IV SOLN
INTRAVENOUS | Status: DC | PRN
  Administered 2015-10-28: 60 mg via INTRAVENOUS

## 2015-10-28 MED ORDER — SUCCINYLCHOLINE CHLORIDE 20 MG/ML IJ SOLN
INTRAMUSCULAR | Status: DC | PRN
  Administered 2015-10-28: 120 mg via INTRAVENOUS

## 2015-10-28 MED ORDER — ACETAMINOPHEN 650 MG RE SUPP: 650.0000 mg | Freq: Four times a day (QID) | RECTAL | Status: DC | PRN

## 2015-10-28 MED ORDER — METOCLOPRAMIDE HCL 5 MG/ML IJ SOLN: 5.0000 mg | Freq: Three times a day (TID) | INTRAMUSCULAR | Status: DC | PRN

## 2015-10-28 MED ORDER — CEFAZOLIN SODIUM-DEXTROSE 2-3 GM-% IV SOLR
2.0000 g | Freq: Four times a day (QID) | INTRAVENOUS | Status: AC
  Administered 2015-10-28 (×2): 2 g via INTRAVENOUS
  Filled 2015-10-28 (×2): qty 50

## 2015-10-28 SURGICAL SUPPLY — 47 items
ADH SKN CLS APL DERMABOND .7 (GAUZE/BANDAGES/DRESSINGS) ×2
ALCOHOL ISOPROPYL (RUBBING) (MISCELLANEOUS) ×2 IMPLANT
BIT DRILL SHORT 4.2 (BIT) IMPLANT
BNDG COHESIVE 4X5 TAN STRL (GAUZE/BANDAGES/DRESSINGS) IMPLANT
CHLORAPREP W/TINT 26ML (MISCELLANEOUS) ×3 IMPLANT
COVER PERINEAL POST (MISCELLANEOUS) ×2 IMPLANT
COVER SURGICAL LIGHT HANDLE (MISCELLANEOUS) ×2 IMPLANT
DERMABOND ADVANCED (GAUZE/BANDAGES/DRESSINGS) ×2
DERMABOND ADVANCED .7 DNX12 (GAUZE/BANDAGES/DRESSINGS) ×2 IMPLANT
DRAPE C-ARM 42X72 X-RAY (DRAPES) ×2 IMPLANT
DRAPE C-ARMOR (DRAPES) ×2 IMPLANT
DRAPE IMP U-DRAPE 54X76 (DRAPES) ×4 IMPLANT
DRAPE STERI IOBAN 125X83 (DRAPES) ×2 IMPLANT
DRAPE U-SHAPE 47X51 STRL (DRAPES) ×4 IMPLANT
DRAPE UNIVERSAL PACK (DRAPES) ×2 IMPLANT
DRILL BIT SHORT 4.2 (BIT) ×2
DRSG MEPILEX BORDER 4X4 (GAUZE/BANDAGES/DRESSINGS) ×3 IMPLANT
DRSG PAD ABDOMINAL 8X10 ST (GAUZE/BANDAGES/DRESSINGS) IMPLANT
ELECT REM PT RETURN 9FT ADLT (ELECTROSURGICAL) ×2
ELECTRODE REM PT RTRN 9FT ADLT (ELECTROSURGICAL) ×1 IMPLANT
FACESHIELD WRAPAROUND (MASK) ×2 IMPLANT
FACESHIELD WRAPAROUND OR TEAM (MASK) ×1 IMPLANT
GLOVE BIO SURGEON STRL SZ8.5 (GLOVE) ×4 IMPLANT
GLOVE BIOGEL PI IND STRL 8.5 (GLOVE) ×1 IMPLANT
GLOVE BIOGEL PI INDICATOR 8.5 (GLOVE) ×1
GOWN STRL REUS W/ TWL LRG LVL3 (GOWN DISPOSABLE) ×2 IMPLANT
GOWN STRL REUS W/TWL 2XL LVL3 (GOWN DISPOSABLE) ×2 IMPLANT
GOWN STRL REUS W/TWL LRG LVL3 (GOWN DISPOSABLE) ×4
GUIDEWIRE 3.2X400 (WIRE) ×1 IMPLANT
KIT ROOM TURNOVER OR (KITS) ×2 IMPLANT
MANIFOLD NEPTUNE II (INSTRUMENTS) ×2 IMPLANT
MARKER SKIN DUAL TIP RULER LAB (MISCELLANEOUS) ×2 IMPLANT
NAIL TROCH FIX LNG 11X340RT (Nail) ×1 IMPLANT
NS IRRIG 1000ML POUR BTL (IV SOLUTION) ×2 IMPLANT
PACK GENERAL/GYN (CUSTOM PROCEDURE TRAY) ×2 IMPLANT
PAD ARMBOARD 7.5X6 YLW CONV (MISCELLANEOUS) ×4 IMPLANT
PADDING CAST ABS 4INX4YD NS (CAST SUPPLIES) ×1
PADDING CAST ABS COTTON 4X4 ST (CAST SUPPLIES) IMPLANT
REAMER ROD DEEP FLUTE 2.5X950 (INSTRUMENTS) ×1 IMPLANT
SCREW LOCK STAR 5X34 (Screw) ×1 IMPLANT
SCREW TFN ADVANCED 70MM (Screw) ×1 IMPLANT
SUT MNCRL AB 3-0 PS2 27 (SUTURE) ×2 IMPLANT
SUT MON AB 2-0 CT1 27 (SUTURE) ×2 IMPLANT
SUT VIC AB 1 CT1 27 (SUTURE) ×2
SUT VIC AB 1 CT1 27XBRD ANBCTR (SUTURE) ×1 IMPLANT
TOWEL OR 17X24 6PK STRL BLUE (TOWEL DISPOSABLE) ×2 IMPLANT
TOWEL OR 17X26 10 PK STRL BLUE (TOWEL DISPOSABLE) ×2 IMPLANT

## 2015-10-28 NOTE — Consult Note (Signed)
PULMONARY / CRITICAL CARE MEDICINE   Name: Barbara Snyder MRN: [id number] DOB: Apr 16, 1920    ADMISSION DATE:  10/25/2015 CONSULTATION DATE:  3/5  REFERRING MD:  Earlie Counts  CHIEF COMPLAINT:  Vent management   HISTORY OF PRESENT ILLNESS:   82yo female with hx dementia, CHF, CAD, frequent falls who was admitted 3/2 with fall and subsequent R hip fx.  SHe was taken to OR for surgical repair on 3/5.  Pt unable to extubate post op for unclear reasons and PCCM consulted for ICU tx and vent management.   PAST MEDICAL HISTORY :  She  has a past medical history of Alzheimer disease; Hypertension; CHF (congestive heart failure) ([place]); Arthritis; Thyroid disease; Myocardial infarct ([place]) (2001); S/P colonoscopy (Apr 2004); Hip pain; Skin rash; Cholesterol blood decreased; Trouble in sleeping; Anxiety; Coronary artery disease; Abdominal aortic aneurysm ([place]); Frequent falls; Closed C2 fracture ([place]) (03/2015); Hematochezia (06/16/2011); SPINAL STENOSIS (01/14/2010); and Hypertrophic cardiomyopathy ([place]) (2011).  PAST SURGICAL HISTORY: She  has past surgical history that includes Mastoidectomy; Colonoscopy (12/20/02); and Colonoscopy (10/16/2011).  No Known Allergies  No current facility-administered medications on file prior to encounter.   Current Outpatient Prescriptions on File Prior to Encounter  Medication Sig  . acetaminophen (TYLENOL) 325 MG tablet Take 325 mg by mouth every 6 (six) hours as needed. For pain  . ENSURE (ENSURE) Take 237 mLs by mouth daily.  . furosemide (LASIX) 40 MG tablet Take 20 mg by mouth daily.   . metoprolol tartrate (LOPRESSOR) 25 MG tablet Take 12.5 mg by mouth 2 (two) times daily.    . Multiple Vitamins-Minerals (MULTIVITAMIN WITH MINERALS) tablet Take 1 tablet by mouth daily.    . sertraline (ZOLOFT) 50 MG tablet Take 50 mg by mouth daily.  Marland Kitchen spironolactone (ALDACTONE) 25 MG tablet Take 25 mg by mouth daily.     FAMILY HISTORY:  Her has no family status information  on file.   SOCIAL HISTORY: She  reports that she has never smoked. She has never used smokeless tobacco. She reports that she does not drink alcohol or use illicit drugs.  REVIEW OF SYSTEMS:   Unable.  Pt on vent, baseline significant dementia.   SUBJECTIVE:    VITAL SIGNS: BP 141/92 mmHg  Pulse 99  Temp(Src) 99.7 F (37.6 C) (Oral)  Resp 23  Wt 31.9 kg (70 lb 5.2 oz)  SpO2 99%  HEMODYNAMICS:    VENTILATOR SETTINGS: Vent Mode:  [-] PRVC FiO2 (%):  [60 %] 60 % Set Rate:  [15 bmp] 15 bmp Vt Set:  [450 mL] 450 mL PEEP:  [5 cmH20] 5 cmH20 Plateau Pressure:  [15 cmH20] 15 cmH20  INTAKE / OUTPUT: I/O last 3 completed shifts: In: 120 [P.O.:120] Out: 600 [Urine:600]  PHYSICAL EXAMINATION: General:  Frail, chronically ill appearing female, NAD  Neuro:  Awake, baseline significant dementia but follows commands intermittently, tracks, MAE HEENT:  ETT, aspen collar  Cardiovascular:  s1s2 rrr Lungs:  resps even non labored on vent, doing well on PS 5/5 with, diminished, few scattered rhonchi L>R  Abdomen:  Soft, non tender, +bs  Musculoskeletal:  Warm and dry, R hip dressing c/d, no edema   LABS:  BMET  Recent Labs Lab 10/25/15 1155 10/26/15 1132 10/27/15 0418  NA 134* 136 140  K 4.5 4.3 3.7  CL 95* 99* 104  CO2 30 23 25   BUN 17 31* 35*  CREATININE 0.74 1.64* 1.22*  GLUCOSE 175* 165* 118*    Electrolytes  Recent Labs Lab  10/25/15 1155 10/26/15 1132 10/27/15 0418 10/27/15 1854  CALCIUM 8.7* 8.5* 8.5*  --   MG  --   --   --  2.4    CBC  Recent Labs Lab 10/25/15 1155 10/26/15 1132 10/27/15 0418  WBC 17.3* 13.9* 13.0*  HGB 11.2* 9.4* 8.3*  HCT 33.0* 26.9* 25.1*  PLT 272 198 182    Coag's No results for input(s): APTT, INR in the last 168 hours.  Sepsis Markers No results for input(s): LATICACIDVEN, PROCALCITON, O2SATVEN in the last 168 hours.  ABG No results for input(s): PHART, PCO2ART, PO2ART in the last 168 hours.  Liver  Enzymes  Recent Labs Lab 10/25/15 1155  AST 30  ALT 15  ALKPHOS 90  BILITOT 0.7  ALBUMIN 3.3*    Cardiac Enzymes  Recent Labs Lab 10/27/15 1854 10/28/15 0054  TROPONINI 0.07* 0.08*    Glucose  Recent Labs Lab 10/26/15 1156  GLUCAP 145*    Imaging Dg Chest Port 1 View  10/27/2015  CLINICAL DATA:  Aspiration pneumonia. EXAM: PORTABLE CHEST 1 VIEW COMPARISON:  Chest x-ray dated 10/25/2015 and chest x-ray dated 08/14/2015. FINDINGS: New dense opacity at the right lung base consistent with the given history of aspiration and/or pneumonia. Left lung remains clear. Cardiomediastinal silhouette appears stable in size and configuration. Extensive atherosclerotic calcifications again noted along the course of the thoracic aorta. Degenerative changes are seen throughout the scoliotic thoracolumbar spine. No acute- appearing osseous abnormality. IMPRESSION: New dense opacity at the right lung base consistent with the given history of aspiration and/or pneumonia. Electronically Signed   By: Franki Cabot M.D.   On: 10/27/2015 20:31   Dg C-arm 61-120 Min  10/28/2015  CLINICAL DATA:  Right femur IM nail EXAM: DG C-ARM 61-120 MIN; RIGHT FEMUR 2 VIEWS COMPARISON:  10/25/2015 FINDINGS: Four images from C-arm radiography shows a IM nail reducing the proximal right femur fracture. The hardware components and fracture fragments are in anatomic alignment. No complications. IMPRESSION: 1. Status post ORIF of proximal femur fracture. Electronically Signed   By: Kerby Moors M.D.   On: 10/28/2015 10:47   Dg Femur, Min 2 Views Right  10/28/2015  CLINICAL DATA:  Right femur IM nail EXAM: DG C-ARM 61-120 MIN; RIGHT FEMUR 2 VIEWS COMPARISON:  10/25/2015 FINDINGS: Four images from C-arm radiography shows a IM nail reducing the proximal right femur fracture. The hardware components and fracture fragments are in anatomic alignment. No complications. IMPRESSION: 1. Status post ORIF of proximal femur fracture.  Electronically Signed   By: Kerby Moors M.D.   On: 10/28/2015 10:47     STUDIES:  Hip xray 3/2>>>  Comminuted intertrochanteric femur fracture on the right with marked varus angulation at the fracture site. CT head 3/2>>>  There is a small amount of fluid in the right mastoid air cells but otherwise no evidence of acute intracranial pathology. This is a stable finding.  Chronic odontoid neck fracture is stable. No acute fracture in the cervical spine.  CULTURES:   ANTIBIOTICS:   SIGNIFICANT EVENTS: 3/5>> ORIF R hip   LINES/TUBES: ETT 3/5>>>  DISCUSSION: 82yo female with dementia, CHF admitted with hip fx.  Remained vented post op hip repair.    ASSESSMENT / PLAN:   Post op respiratory failure-- unclear why she was left intubated.  ?r/t anemia per CRNA. Now awake, alert, tol SBT.  P:   Cont PS 5/5  CXR now  Anticipate extubation if remains comfortable on wean  Discussed at length with family -  pt is DNR/DNI and if she failed extubation she would NOT want reintubation. They know that his surgery was likely "hard on her weak heart".    Hx dCHF  P:  Cont hold outpt lasix, aldactone  Cont metoprolol  Monitor  Anemia - suspect anemia of chronic disease.  P:  F/u CBC closely r/o peri-op bleeding  Transfuse if Hgb <7 Should not prevent extubation   Alzheimer's dementia  P:  Caution with analgesia  Hold home meds   Leukocytosis - -Likely reactive P:  UA and chest x-ray unremarkable F/u cbc   R Hip Fx - s/p ORIF 3/5 P:  Per ortho    FAMILY  - Updates:  Son, daughter updated at bedside 3/5   If remains stable can move out of ICU later this afternoon.  D/w Dr. Earlie Counts - [facility] will stay as primary and resume care in am 3/6.     Nickolas Madrid, NP 10/28/2015  12:21 PM Pager: [phone] or [phone]

## 2015-10-28 NOTE — Progress Notes (Signed)
[facility] PROGRESS NOTE  Barbara Snyder [id number] DOB: 1920-04-21 DOA: 10/25/2015 PCP: Asencion Noble, MD  HPI/Brief narrative 82 year old female with history of CHF, coronary artery disease, spinal stenosis, multiple falls present and with fall and subsequent right hip pain. Patient found an old C2 fracture and currently an Aspen collar. Patient was transferred from [facility]. Orthopedic surgery consulted, plan for surgery this afternoon.  Assessment/Plan: Right intertrochanteric femur fracture status post fall -X-ray of the right hip showed comminuted intertrochanteric femur fracture on the right with marked varus angulation -Orthopedic surgery consulted -Given patient's age as well as comorbidities, she is high risk for surgery -Seen by Cardiology - again, considered high risk for surgery -Patient did undergo Cephalomedullary fixation of R pertrochanteric femur fracture  History of C2 fracture -Occurred in 2016 -Remains with Aspen collar  Leukocytosis -Likely reactive -UA and chest x-ray unremarkable  Hyperglycemia -Hemoglobin A1c 5.5 -Will continue to monitor CBGs  Alzheimer's dementia -Currently on hold all medications will continue to monitor closely   History of CHF -Echocardiogram 12/03/2009 showed an EF of 75%, no regional wall motion abnormalities -Currently euvolemic, continue to monitor closely -Monitor intake and output, daily weight -On Lasix and spironolactone held -Continue metoprolol as tolerated  Tachycardia -Seen by Cardiology -Continue beta blocker as tolerated -Per cardiology, if HR becomes difficult to control, consideration for loading with amiodarone  Code Status: DNR Family Communication: Pt in room Disposition Plan: Possible SNF after surgery   Consultants:  Orthopedic Surgery  Cardiology  Procedures:    Antibiotics: Anti-infectives    Start     Dose/Rate Route Frequency Ordered Stop   10/28/15 1200  ceFAZolin  (ANCEF) IVPB 2 g/50 mL premix     2 g 100 mL/hr over 30 Minutes Intravenous Every 6 hours 10/28/15 1117 10/28/15 2359   10/26/15 1800  ceFAZolin (ANCEF) IVPB 2 g/50 mL premix  Status:  Discontinued     2 g 100 mL/hr over 30 Minutes Intravenous To ShortStay Surgical 10/26/15 0805 10/26/15 1742      HPI/Subjective: Cannot determine given dementia  Objective: Filed Vitals:   10/28/15 1253 10/28/15 1300 10/28/15 1400 10/28/15 1500  BP:  87/59 86/57 82/57   Pulse:  99 89 99  Temp: 97.9 F (36.6 C)     TempSrc: Oral     Resp:  19 18 21   Weight:      SpO2:  97% 99% 97%    Intake/Output Summary (Last 24 hours) at 10/28/15 1559 Last data filed at 10/28/15 1400  Gross per 24 hour  Intake   1885 ml  Output    760 ml  Net   1125 ml   Filed Weights   10/25/15 1051 10/26/15 0701 10/27/15 0459  Weight: 29.484 kg (65 lb) 34.2 kg (75 lb 6.4 oz) 31.9 kg (70 lb 5.2 oz)    Exam:   General:  Awake, laying in bed, in nad, confused, awake  Cardiovascular: regular, s1, s2  Respiratory: normal resp effort, no wheezing  Abdomen: soft, nondistended  Musculoskeletal: perfused, no clubbing, no cyanosis  Data Reviewed: Basic Metabolic Panel:  Recent Labs Lab 10/25/15 1155 10/26/15 1132 10/27/15 0418 10/27/15 1854  NA 134* 136 140  --   K 4.5 4.3 3.7  --   CL 95* 99* 104  --   CO2 30 23 25   --   GLUCOSE 175* 165* 118*  --   BUN 17 31* 35*  --   CREATININE 0.74 1.64* 1.22*  --   CALCIUM 8.7*  8.5* 8.5*  --   MG  --   --   --  2.4   Liver Function Tests:  Recent Labs Lab 10/25/15 1155  AST 30  ALT 15  ALKPHOS 90  BILITOT 0.7  PROT 6.5  ALBUMIN 3.3*   No results for input(s): LIPASE, AMYLASE in the last 168 hours. No results for input(s): AMMONIA in the last 168 hours. CBC:  Recent Labs Lab 10/25/15 1155 10/26/15 1132 10/27/15 0418 10/28/15 1513  WBC 17.3* 13.9* 13.0* 12.3*  NEUTROABS 15.6*  --   --   --   HGB 11.2* 9.4* 8.3* 7.6*  HCT 33.0* 26.9* 25.1* 22.8*   MCV 98.8 96.4 97.3 95.4  PLT 272 198 182 PENDING   Cardiac Enzymes:  Recent Labs Lab 10/27/15 1854 10/28/15 0054  TROPONINI 0.07* 0.08*   BNP (last 3 results) No results for input(s): BNP in the last 8760 hours.  ProBNP (last 3 results) No results for input(s): PROBNP in the last 8760 hours.  CBG:  Recent Labs Lab 10/26/15 1156  GLUCAP 145*    Recent Results (from the past 240 hour(s))  Urine culture     Status: None   Collection Time: 10/25/15 11:50 AM  Result Value Ref Range Status   Specimen Description URINE, CATHETERIZED  Final   Special Requests NONE  Final   Culture   Final    NO GROWTH 1 DAY Performed at [facility]    Report Status 10/26/2015 FINAL  Final  Surgical pcr screen     Status: Abnormal   Collection Time: 10/26/15 11:28 AM  Result Value Ref Range Status   MRSA, PCR NEGATIVE NEGATIVE Final   Staphylococcus aureus POSITIVE (A) NEGATIVE Final    Comment:        The Xpert SA Assay (FDA approved for NASAL specimens in patients over 98 years of age), is one component of a comprehensive surveillance program.  Test performance has been validated by [facility] for patients greater than or equal to 39 year old. It is not intended to diagnose infection nor to guide or monitor treatment.      Studies: Pelvis Portable  10/28/2015  CLINICAL DATA:  Postop IM nail placement EXAM: PORTABLE PELVIS 1-2 VIEWS COMPARISON:  Portable exam 1202 hours compared to intraoperative images of 10/28/2015 FINDINGS: Distal extent of nail not imaged. IM nail with compression screw at proximal femur across the intertrochanteric fracture. No femoral head dislocation. Hip joint spaces symmetric and preserved. Bones demineralized. Extensive atherosclerotic calcifications. IMPRESSION: IM nail with compression screw at proximal RIGHT femur post ORIF. Osseous demineralization without additional fracture dislocation. Electronically Signed   By: Lavonia Dana M.D.   On:  10/28/2015 12:32   Dg Chest Port 1 View  10/27/2015  CLINICAL DATA:  Aspiration pneumonia. EXAM: PORTABLE CHEST 1 VIEW COMPARISON:  Chest x-ray dated 10/25/2015 and chest x-ray dated 08/14/2015. FINDINGS: New dense opacity at the right lung base consistent with the given history of aspiration and/or pneumonia. Left lung remains clear. Cardiomediastinal silhouette appears stable in size and configuration. Extensive atherosclerotic calcifications again noted along the course of the thoracic aorta. Degenerative changes are seen throughout the scoliotic thoracolumbar spine. No acute- appearing osseous abnormality. IMPRESSION: New dense opacity at the right lung base consistent with the given history of aspiration and/or pneumonia. Electronically Signed   By: Franki Cabot M.D.   On: 10/27/2015 20:31   Dg C-arm 61-120 Min  10/28/2015  CLINICAL DATA:  Right femur IM nail EXAM:  DG C-ARM 61-120 MIN; RIGHT FEMUR 2 VIEWS COMPARISON:  10/25/2015 FINDINGS: Four images from C-arm radiography shows a IM nail reducing the proximal right femur fracture. The hardware components and fracture fragments are in anatomic alignment. No complications. IMPRESSION: 1. Status post ORIF of proximal femur fracture. Electronically Signed   By: Kerby Moors M.D.   On: 10/28/2015 10:47   Dg Femur, Min 2 Views Right  10/28/2015  CLINICAL DATA:  Right femur IM nail EXAM: DG C-ARM 61-120 MIN; RIGHT FEMUR 2 VIEWS COMPARISON:  10/25/2015 FINDINGS: Four images from C-arm radiography shows a IM nail reducing the proximal right femur fracture. The hardware components and fracture fragments are in anatomic alignment. No complications. IMPRESSION: 1. Status post ORIF of proximal femur fracture. Electronically Signed   By: Kerby Moors M.D.   On: 10/28/2015 10:47   Dg Femur Port, Min 2 Views Right  10/28/2015  CLINICAL DATA:  Postop femur fixation. EXAM: RIGHT FEMUR PORTABLE 1 VIEW COMPARISON:  Earlier today at 959 hours. FINDINGS: Vascular  calcifications. Intra medullary rod with proximal and distal fixation devices across the femur. No acute hardware complication. Intertrochanteric femur fracture again identified, status post reduction. IMPRESSION: Interval fixation of intertrochanteric right femur fracture. Electronically Signed   By: Abigail Miyamoto M.D.   On: 10/28/2015 13:09    Scheduled Meds: . antiseptic oral rinse  7 mL Mouth Rinse q12n4p  .  ceFAZolin (ANCEF) IV  2 g Intravenous Q6H  . chlorhexidine  15 mL Mouth Rinse BID  . metoprolol  2.5 mg Intravenous 4 times per day  . pneumococcal 23 valent vaccine  0.5 mL Intramuscular Tomorrow-1000   Continuous Infusions: . dextrose 5 % and 0.9% NaCl 70 mL/hr (10/26/15 0540)    Principal Problem:   Intertrochanteric fracture of right femur (HCC) Active Problems:   Multiple falls   Multiple skin tears   CAD (coronary artery disease)   Hypertrophic cardiomyopathy ([place])   Essential hypertension   Alzheimer's dementia without behavioral disturbance   Leukocytosis   Hyperglycemia   Pertrochanteric fracture of right femur ([place])   [facility], [facility] Pager [phone]. If 7PM-7AM, please contact night-coverage at [email or web address], password [facility] 10/28/2015, 3:59 PM  LOS: 3 days

## 2015-10-28 NOTE — Progress Notes (Signed)
Rod Can, MD Physician Signed Orthopedics Interval H&P Note 10/26/2015 4:47 PM    Expand All Collapse All   History and Physical Interval Note:  10/26/2015 4:47 PM  Barbara Snyder has presented today for surgery, with the diagnosis of Right Femur fracture The various methods of treatment have been discussed with the patient and family. After consideration of risks, benefits and other options for treatment, the patient has consented to Procedure(s): INTRAMEDULLARY (IM) NAIL INTERTROCHANTRIC (Right) as a surgical intervention . The patient's history has been reviewed, patient examined, no change in status, stable for surgery. I have reviewed the patient's chart and labs. Questions were answered to the patient's satisfaction.   The risks, benefits, and alternatives were discussed with the patient. There are risks associated with the surgery including, but not limited to, problems with anesthesia (death), infection, differences in leg length/angulation/rotation, fracture of bones, loosening or failure of implants, malunion, nonunion, hematoma (blood accumulation) which may require surgical drainage, blood clots, pulmonary embolism, nerve injury (foot drop), and blood vessel injury. The patient understands these risks and elects to proceed.    Barbara Snyder, Barbara Snyder       Echo completed.

## 2015-10-28 NOTE — Progress Notes (Signed)
Patient ID: Barbara Snyder, female   DOB: [birth year], 82 y.o.   MRN: [id number]    Patient is off floor for surgery  Assessment/Plan    1. Preoperative evlatuion - cardiology consulted in setting of hip fracture with plans for surgical repair - patient does not answer questions, unable to gather history regarding cardiac symptoms or exercise capacity due to advanced dementia - echo shows hyperdynamic LV 65-70%. Prior echos mention HOCM however cannot distinguish wall thickness well from current study. There is an intracavitary gradient primarily due to hyperdynamic LV and chamber systolic collapse. Limited evaluation of AV but gradient even in the setting of some subvalvular gradient velocity acceleration as well is moderate at 20.  - from an overall standpoint she is increased risk for hip surgery given her advanced age and significant fragility. From a cardiac specific standpoint her risk would not be prohibitive. She will need adequate hydration throughout given her hyperdynamic LV, her AS is not severe enough to limit her candidacy for surgery. Further noninvasive testing would not significantly affect her risk assessment and only delay her course  2. Tachycardia - episodes of tachycardia yesterday. EKG reviewed, difficult to determine rhythm. Tele reviewed, appears to be sinus tach as well as episodes of PSVT and MAT. Do not see afib specifically, at this time no indication to anticoagulate but will continue to monitor -She is not taking anything PO, she currently is on lopressor 2.5mg  IV q6 hrs.  Appears doses held at times due to low blood pressures.  - if arrhythmias continue to be an issue may need consideration for amiodarone loading if bp's remain too soft for increased IV lopressor.     Patient off floor for surgery, no charge for rounding today. Contact us if issues with rhythm in periop period.     Carlyle Dolly, M.D.

## 2015-10-28 NOTE — Anesthesia Preprocedure Evaluation (Addendum)
Anesthesia Evaluation  Patient identified by MRN, date of birth, ID band Patient awake    Reviewed: Allergy & Precautions, NPO status , Patient's Chart, lab work & pertinent test results, reviewed documented beta blocker date and time   Airway Mallampati: II  TM Distance: >3 FB Neck ROM: Full    Dental  (+) Teeth Intact   Pulmonary neg pulmonary ROS,    breath sounds clear to auscultation       Cardiovascular hypertension, Pt. on medications + CAD, + Past MI, + Peripheral Vascular Disease and +CHF  + dysrhythmias Atrial Fibrillation  Rhythm:Irregular Rate:Tachycardia     Neuro/Psych    GI/Hepatic Neg liver ROS, hiatal hernia, History noted. CE   Endo/Other  negative endocrine ROS  Renal/GU negative Renal ROS     Musculoskeletal   Abdominal   Peds  Hematology   Anesthesia Other Findings Needs dental hygiene  Reproductive/Obstetrics                            Anesthesia Physical Anesthesia Plan  ASA: IV  Anesthesia Plan: General   Post-op Pain Management:    Induction: Intravenous  Airway Management Planned: Oral ETT  Additional Equipment:   Intra-op Plan:   Post-operative Plan: Possible Post-op intubation/ventilation  Informed Consent: I have reviewed the patients History and Physical, chart, labs and discussed the procedure including the risks, benefits and alternatives for the proposed anesthesia with the patient or authorized representative who has indicated his/her understanding and acceptance.   Dental advisory given  Plan Discussed with: CRNA and Anesthesiologist  Anesthesia Plan Comments:         Anesthesia Quick Evaluation

## 2015-10-28 NOTE — Procedures (Signed)
Extubation Procedure Note  Patient Details:   Name: Barbara Snyder DOB: Apr 01, 1920 MRN: [id number]   Airway Documentation:     Evaluation  O2 sats: stable throughout Complications: No apparent complications Patient did tolerate procedure well. Bilateral Breath Sounds:  (sl coarse) Suctioning: Airway Yes   Pt extubated per MD order.  Pt placed on 3l [place]. With sats of 98%.  RT will continue to monitor.  Pierre Bali 10/28/2015, 12:33 PM

## 2015-10-28 NOTE — Anesthesia Postprocedure Evaluation (Signed)
Anesthesia Post Note  Patient: Barbara Snyder  Procedure(s) Performed: Procedure(s) (LRB): INTRAMEDULLARY (IM) NAIL INTERTROCHANTRIC (Right)  Patient location during evaluation: PACU Anesthesia Type: General Level of consciousness: patient remains intubated per anesthesia plan Pain management: pain level controlled Vital Signs Assessment: post-procedure vital signs reviewed and stable Respiratory status: patient remains intubated per anesthesia plan Anesthetic complications: no    Last Vitals:  Filed Vitals:   10/28/15 0516 10/28/15 1105  BP: 101/54 149/68  Pulse: 120 84  Temp: 37.6 C   Resp: 18 17    Last Pain:  Filed Vitals:   10/28/15 1110  PainSc: 6                  EDWARDS,Tacori Kvamme

## 2015-10-28 NOTE — Progress Notes (Signed)
Utilization Review Completed.Prateek Knipple T3/12/2015  

## 2015-10-28 NOTE — Discharge Instructions (Signed)
Dr. Rod Can Adult Hip & Knee Specialist [facility] [address] [place], [place] [address] [phone]   POSTOPERATIVE DIRECTIONS    Hip Rehabilitation, Guidelines Following Surgery   WEIGHT BEARING Partial weight bearing with assist device as directed.  30% - Touch Down Weight Bearing   HOME CARE INSTRUCTIONS  Remove items at home which could result in a fall. This includes throw rugs or furniture in walking pathways.  Continue medications as instructed at time of discharge.  You may have some home medications which will be placed on hold until you complete the course of blood thinner medication.  4 days after discharge, you may start showering. No tub baths or soaking your incisions. Do not put on socks or shoes without following the instructions of your caregivers.   Sit on chairs with arms. Use the chair arms to help push yourself up when arising.  Arrange for the use of a toilet seat elevator so you are not sitting low.   Walk with walker as instructed.  You may resume a sexual relationship in one month or when given the OK by your caregiver.  Use walker as long as suggested by your caregivers.  Avoid periods of inactivity such as sitting longer than an hour when not asleep. This helps prevent blood clots.  You may return to work once you are cleared by Engineer, production.  Do not drive a car for 6 weeks or until released by your surgeon.  Do not drive while taking narcotics.  Wear elastic stockings for two weeks following surgery during the day but you may remove then at night.  Make sure you keep all of your appointments after your operation with all of your doctors and caregivers. You should call the office at the above phone number and make an appointment for approximately two weeks after the date of your surgery. Please pick up a stool softener and laxative for home use as long as you are requiring pain medications.  ICE to the affected  hip every three hours for 30 minutes at a time and then as needed for pain and swelling. Continue to use ice on the hip for pain and swelling from surgery. You may notice swelling that will progress down to the foot and ankle.  This is normal after surgery.  Elevate the leg when you are not up walking on it.   It is important for you to complete the blood thinner medication as prescribed by your doctor.  Continue to use the breathing machine which will help keep your temperature down.  It is common for your temperature to cycle up and down following surgery, especially at night when you are not up moving around and exerting yourself.  The breathing machine keeps your lungs expanded and your temperature down.  RANGE OF MOTION AND STRENGTHENING EXERCISES  These exercises are designed to help you keep full movement of your hip joint. Follow your caregiver's or physical therapist's instructions. Perform all exercises about fifteen times, three times per day or as directed. Exercise both hips, even if you have had only one joint replacement. These exercises can be done on a training (exercise) mat, on the floor, on a table or on a bed. Use whatever works the best and is most comfortable for you. Use music or television while you are exercising so that the exercises are a pleasant break in your day. This will make your life better with the exercises acting as a break in routine  you can look forward to.  Lying on your back, slowly slide your foot toward your buttocks, raising your knee up off the floor. Then slowly slide your foot back down until your leg is straight again.  Lying on your back spread your legs as far apart as you can without causing discomfort.  Lying on your side, raise your upper leg and foot straight up from the floor as far as is comfortable. Slowly lower the leg and repeat.  Lying on your back, tighten up the muscle in the front of your thigh (quadriceps muscles). You can do this by keeping  your leg straight and trying to raise your heel off the floor. This helps strengthen the largest muscle supporting your knee.  Lying on your back, tighten up the muscles of your buttocks both with the legs straight and with the knee bent at a comfortable angle while keeping your heel on the floor.   SKILLED REHAB INSTRUCTIONS: If the patient is transferred to a skilled rehab facility following release from the hospital, a list of the current medications will be sent to the facility for the patient to continue.  When discharged from the skilled rehab facility, please have the facility set up the patient's [place] prior to being released. Also, the skilled facility will be responsible for providing the patient with their medications at time of release from the facility to include their pain medication and their blood thinner medication. If the patient is still at the rehab facility at time of the two week follow up appointment, the skilled rehab facility will also need to assist the patient in arranging follow up appointment in our office and any transportation needs.  MAKE SURE YOU:  Understand these instructions.  Will watch your condition.  Will get help right away if you are not doing well or get worse.  Pick up stool softner and laxative for home use following surgery while on pain medications. Daily dry dressing changes as needed. In 4 days, you may remove your dressings and begin taking showers - no tub baths or soaking the incisions. Continue to use ice for pain and swelling after surgery. Do not use any lotions or creams on the incision until instructed by your surgeon.

## 2015-10-28 NOTE — Transfer of Care (Signed)
Immediate Anesthesia Transfer of Care Note  Patient: Barbara Snyder  Procedure(s) Performed: Procedure(s): INTRAMEDULLARY (IM) NAIL INTERTROCHANTRIC (Right)  Patient Location: ICU  Anesthesia Type:General  Level of Consciousness: awake and patient cooperative  Airway & Oxygen Therapy: Patient Spontanous Breathing but to mechanical ventilator for support   Post-op Assessment: Report given to RN and Post -op Vital signs reviewed and stable  Post vital signs: Reviewed and stable  Last Vitals:  Filed Vitals:   10/28/15 0516 10/28/15 1105  BP: 101/54 149/68  Pulse: 120 84  Temp: 37.6 C   Resp: 18 17    Complications: No apparent anesthesia complications

## 2015-10-28 NOTE — Anesthesia Postprocedure Evaluation (Addendum)
Anesthesia Post Note  Patient: Barbara Snyder  Procedure(s) Performed: Procedure(s) (LRB): INTRAMEDULLARY (IM) NAIL INTERTROCHANTRIC (Right)  Patient location during evaluation: SICU Anesthesia Type: General Level of consciousness: awake and patient cooperative Pain management: pain level controlled Respiratory status: patient on ventilator - see flowsheet for VS and patient remains intubated per anesthesia plan Cardiovascular status: blood pressure returned to baseline and stable Postop Assessment: no signs of nausea or vomiting Anesthetic complications: no    Last Vitals:  Filed Vitals:   10/28/15 0516 10/28/15 1105  BP: 101/54 149/68  Pulse: 120 84  Temp: 37.6 C   Resp: 18 17    Last Pain:  Filed Vitals:   10/28/15 1110  PainSc: 6                  Nykole Matos

## 2015-10-28 NOTE — Anesthesia Procedure Notes (Signed)
Procedure Name: Intubation Date/Time: 10/28/2015 8:39 AM Performed by: Terrill Mohr Pre-anesthesia Checklist: Patient identified, Emergency Drugs available, Suction available and Patient being monitored Patient Re-evaluated:Patient Re-evaluated prior to inductionOxygen Delivery Method: Circle system utilized Preoxygenation: Pre-oxygenation with 100% oxygen Intubation Type: IV induction Ventilation: Mask ventilation without difficulty Laryngoscope Size: Mac, 3 and Glidescope Grade View: Grade II Tube type: Subglottic suction tube Tube size: 7.5 mm Number of attempts: 1 Airway Equipment and Method: Video-laryngoscopy and Stylet Placement Confirmation: positive ETCO2,  ETT inserted through vocal cords under direct vision and breath sounds checked- equal and bilateral Secured at: 22 (cm at teeth) cm Tube secured with: Tape Dental Injury: Teeth and Oropharynx as per pre-operative assessment

## 2015-10-28 NOTE — Brief Op Note (Signed)
10/25/2015 - 10/28/2015  10:06 AM  PATIENT:  Barbara Snyder  82 y.o. female  PRE-OPERATIVE DIAGNOSIS:  Right Pertrochanteric Femur fracture  POST-OPERATIVE DIAGNOSIS:  Right Pertrochanteric Femur fracture  PROCEDURE:  Procedure(s): INTRAMEDULLARY (IM) NAIL INTERTROCHANTRIC (Right)  SURGEON:  Surgeon(s) and Role:    * Rod Can, MD - Primary  PHYSICIAN ASSISTANT: Bryson Stilwell, PA-C.  ASSISTANTS: none   ANESTHESIA:   general  EBL:  Total I/O In: [id number] [I.V.:800; Blood:335; IV Piggyback:500] Out: 100 [Blood:100]  BLOOD ADMINISTERED: 1 unit PRBCs  DRAINS: none   LOCAL MEDICATIONS USED:  NONE  SPECIMEN:  No Specimen  DISPOSITION OF SPECIMEN:  N/A  COUNTS:  YES  TOURNIQUET:  * No tourniquets in log *  DICTATION: .Other Dictation: Dictation Number [id number]  PLAN OF CARE: Admit to inpatient   PATIENT DISPOSITION:  PACU - hemodynamically stable.   Delay start of Pharmacological VTE agent (>24hrs) due to surgical blood loss or risk of bleeding: no

## 2015-10-28 NOTE — Op Note (Signed)
NAME:  Barbara Snyder, Barbara Snyder NO.:  [id number]  MEDICAL RECORD NO.:  [id number]  LOCATION:  [id number]                        FACILITY:  [facility]  PHYSICIAN:  Rod Can, MD     DATE OF BIRTH:  1920/06/09  DATE OF PROCEDURE:  10/28/2015 DATE OF DISCHARGE:                              OPERATIVE REPORT   SURGEON:  Rod Can, MD.  ASSISTANT:  Wyatt Portela, PA-C.  PREOPERATIVE DIAGNOSIS:  Comminuted right pertrochanteric femur fracture.  POSTOPERATIVE DIAGNOSIS:  Comminuted right pertrochanteric femur fracture.  PROCEDURE PERFORMED:  Cephalomedullary fixation of right pertrochanteric femur fracture.  IMPLANTS: 1. Synthes TFN advanced 11 x 340 mm nail, 130 degrees. 2. TFN advanced titanium lag screw 70 mm. 3. 5.0 x 34 mm distal interlocking screw x1.  ANESTHESIA:  General.  ANTIBIOTICS:  2 g Ancef.  COMPLICATIONS:  None.  TUBES AND DRAINS:  None.  DISPOSITION:  Stable to PACU.  INDICATIONS:  The patient is a 82 year old female with multiple medical problems, including history of many recent falls.  She had a fall late last year which resulted in a C2 fracture, which is being treated in a cervical collar.  She had a recent fall, had hip pain, and inability to weight bear.  She was worked up by Cardiology and found to be high risk. We discussed the risks, benefits, and alternatives to surgery, the patient desired to proceed with surgery in order to give her a chance to be able to ambulate in the future.  DESCRIPTION OF PROCEDURE IN DETAIL:  The patient was identified in the holding area using 2 identifiers.  Surgical site was marked by myself. She was taken to the operating room and general anesthesia was induced on her bed.  She was then transferred to the [facility] table.  The left lower extremity was scissored underneath the right.  The fracture was reduced with traction, internal rotation, and adduction.  She was found to have an extremely comminuted  pertrochanteric femur fracture.  I was able to achieve an acceptable closed reduction.  The right hip was prepped and draped in normal sterile surgical fashion.  Time-out was called verifying side and site of surgery.  I began by making a standard 4 cm incision proximal to the tip of the trochanter.  She did have apex anterior angulation that I had the PA hold this reduced with an anterior directed force.  Using a mallet, I established the standard starting point for a trochanteric entry nail using the awl.  This was confirmed on AP and lateral fluoroscopy views.  I then passed the guide pin.  I then used the entry reamer.  I then placed the guidewire down to the physeal scar of the knee.  I then measured the length of the nail. I reamed to a 12.5 mm reamer with chatter.  I then selected the real nail, which was impacted into place.  The PA was required to hold the reduction manually using an anterior directed force with a mallet.  Once the nail was seated, I then made a separate stab incision through the jig.  I passed the cannula down to the bone for the cephalomedullary device.  I placed a guide pin, which was  center-center.  I then measured, reamed, and placed the real lag screw.  Prior to lag screw insertion, I did place an anti-rotational guide pin into the femoral head.  Once the screw was seated, I locked the set screw proximally. The jig was then removed.  Using perfect circle technique, I then placed a single distal interlocking screw.  I then confirmed AP and lateral fluoroscopy views.  Our fraction reduction was acceptable.  There was no chondral penetration.  Tip apex distance was appropriate.  I then copiously irrigated the wounds.  The wounds were closed in layers with #1 Vicryl for the fascia, 2-0 Monocryl for the deep dermal layer, running 3-0 Monocryl subcuticular stitch.  Dermabond was applied.  Once the glue was hardened, Mepilex dressing was applied.  The patient  was then transferred to her bed, extubated, taken to PACU in stable condition.  Sponge, needle, and instrument counts were correct at the end of the case x2.  There were no known complications.  We will readmit the patient to the Hospitalist Service.  She will be touchdown weightbearing to the right lower extremity.  Due to her high risk of falls and bleeding, she has been deemed not a candidate for chemical DVT prophylaxis. She will work with physical therapy.  She will likely need skilled nursing placement.  I will see her 2 weeks after discharge.  All questions solicited and answered.          ______________________________ Rod Can, MD     BS/MEDQ  D:  10/28/2015  T:  10/28/2015  Job:  [id number]

## 2015-10-29 ENCOUNTER — Encounter (HOSPITAL_COMMUNITY): Payer: Self-pay | Admitting: Orthopedic Surgery

## 2015-10-29 DIAGNOSIS — R000 Tachycardia, unspecified: Secondary | ICD-10-CM

## 2015-10-29 DIAGNOSIS — S72141A Displaced intertrochanteric fracture of right femur, initial encounter for closed fracture: Secondary | ICD-10-CM | POA: Diagnosis not present

## 2015-10-29 DIAGNOSIS — F0390 Unspecified dementia without behavioral disturbance: Secondary | ICD-10-CM | POA: Insufficient documentation

## 2015-10-29 LAB — BASIC METABOLIC PANEL
ANION GAP: 8 (ref 5–15)
BUN: 16 mg/dL (ref 6–20)
CHLORIDE: 112 mmol/L — AB (ref 101–111)
CO2: 25 mmol/L (ref 22–32)
Calcium: 8.1 mg/dL — ABNORMAL LOW (ref 8.9–10.3)
Creatinine, Ser: 0.71 mg/dL (ref 0.44–1.00)
GFR calc Af Amer: >60 mL/min (ref >60)
Glucose, Bld: 105 mg/dL — ABNORMAL HIGH (ref 65–99)
POTASSIUM: 3.2 mmol/L — AB (ref 3.5–5.1)
SODIUM: 145 mmol/L (ref 135–145)

## 2015-10-29 LAB — GLUCOSE, CAPILLARY
GLUCOSE-CAPILLARY: 96 mg/dL (ref 65–99)
GLUCOSE-CAPILLARY: 95 mg/dL (ref 65–99)
Glucose-Capillary: 104 mg/dL — ABNORMAL HIGH (ref 65–99)

## 2015-10-29 LAB — CBC
HCT: 24.4 % — ABNORMAL LOW (ref 36.0–46.0)
Hemoglobin: 8.1 g/dL — ABNORMAL LOW (ref 12.0–15.0)
MCH: 31.6 pg (ref 26.0–34.0)
MCHC: 33.2 g/dL (ref 30.0–36.0)
MCV: 95.3 fL (ref 78.0–100.0)
Platelets: 80 10*3/uL — ABNORMAL LOW (ref 150–400)
RBC: 2.56 MIL/uL — ABNORMAL LOW (ref 3.87–5.11)
RDW: 17.2 % — ABNORMAL HIGH (ref 11.5–15.5)
WBC: 13.7 10*3/uL — ABNORMAL HIGH (ref 4.0–10.5)

## 2015-10-29 LAB — POCT I-STAT 4, (NA,K, GLUC, HGB,HCT)
Glucose, Bld: 102 mg/dL — ABNORMAL HIGH (ref 65–99)
HCT: 23 % — ABNORMAL LOW (ref 36.0–46.0)
Hemoglobin: 7.8 g/dL — ABNORMAL LOW (ref 12.0–15.0)
POTASSIUM: 3.9 mmol/L (ref 3.5–5.1)
SODIUM: 142 mmol/L (ref 135–145)

## 2015-10-29 MED ORDER — METOPROLOL TARTRATE 25 MG PO TABS
25.0000 mg | ORAL_TABLET | Freq: Three times a day (TID) | ORAL | Status: DC
  Administered 2015-10-29 – 2015-10-30 (×2): 25 mg via ORAL
  Filled 2015-10-29 (×3): qty 1

## 2015-10-29 MED ORDER — POTASSIUM CHLORIDE 20 MEQ PO PACK
40.0000 meq | PACK | Freq: Once | ORAL | Status: AC
  Administered 2015-10-29: 40 meq via ORAL
  Filled 2015-10-29: qty 2

## 2015-10-29 NOTE — Evaluation (Signed)
Physical Therapy Evaluation Patient Details Name: RICCA OSTERMILLER MRN: [id number] DOB: Sep 19, 1919 Today's Date: 10/29/2015   History of Present Illness  82 yo admitted after fall at home with right femur fx s/p IM nail. PMHx: C2 fx 2016, Alzheimers, CHF, CAD, spinal stenosis  Clinical Impression  On arrival pt in bed with bil knees bent and both hips fully rotated to left. Pt very HOH and unable to provide PLOF which son [name] provided via phone. Pt with very limited movement of all extremities at this time and required total assist for bed as well as bed to chair transfers. Pt with decreased strength, ROM, function, safety and cognition who will benefit from acute therapy to maximize mobility, function and strength to decrease burden of care.     Follow Up Recommendations SNF;Supervision/Assistance - 24 hour    Equipment Recommendations  None recommended by PT    Recommendations for Other Services       Precautions / Restrictions Precautions Precautions: Fall Required Braces or Orthoses: Cervical Brace Cervical Brace: At all times;Soft collar Restrictions Weight Bearing Restrictions: Yes RLE Weight Bearing: Partial weight bearing RLE Partial Weight Bearing Percentage or Pounds: 30%      Mobility  Bed Mobility Overal bed mobility: Needs Assistance Bed Mobility: Supine to Sit     Supine to sit: Total assist     General bed mobility comments: pt with limited participation to move legs and elevate trunk toward EOB.   Transfers Overall transfer level: Needs assistance   Transfers: Squat Pivot Transfers     Squat pivot transfers: Total assist     General transfer comment: pt unable to assist with scooting to EOB, total assist with pad to lift pt bed to chair and an additional time to rise from chair for cushion positioning in chair  Ambulation/Gait                Stairs            Wheelchair Mobility    Modified Rankin (Stroke Patients Only)       Balance Overall balance assessment: Needs assistance   Sitting balance-Leahy Scale: Poor       Standing balance-Leahy Scale: Zero                               Pertinent Vitals/Pain Pain Assessment:  (PAINAD= 3)    Home Living Family/patient expects to be discharged to:: Private residence Living Arrangements: Children Available Help at Discharge: Available 24 hours/day Type of Home: House       Home Layout: One level Home Equipment: Environmental consultant - 2 wheels;Wheelchair - manual;Bedside commode      Prior Function Level of Independence: Needs assistance   Gait / Transfers Assistance Needed: pt was transferring OOB to [facility] on her own and walking in the house with RW grossly 30'  ADL's / Homemaking Assistance Needed: son performed max assist for bathing and dressing as well as assist onto shower seat        Hand Dominance        Extremity/Trunk Assessment   Upper Extremity Assessment: Generalized weakness;Difficult to assess due to impaired cognition           Lower Extremity Assessment: Generalized weakness;Difficult to assess due to impaired cognition      Cervical / Trunk Assessment: Kyphotic  Communication   Communication: HOH (no hearing aids)  Cognition Arousal/Alertness: Awake/alert Behavior During Therapy: WFL for tasks assessed/performed  Overall Cognitive Status: No family/caregiver present to determine baseline cognitive functioning       Memory: Decreased short-term memory;Decreased recall of precautions              General Comments      Exercises        Assessment/Plan    PT Assessment Patient needs continued PT services  PT Diagnosis Difficulty walking;Generalized weakness;Acute pain;Altered mental status   PT Problem List Decreased strength;Decreased range of motion;Decreased activity tolerance;Decreased balance;Decreased mobility;Decreased coordination;Pain;Decreased safety awareness;Decreased skin integrity;Decreased  knowledge of use of DME;Decreased cognition  PT Treatment Interventions DME instruction;Functional mobility training;Therapeutic activities;Therapeutic exercise;Balance training;Patient/family education   PT Goals (Current goals can be found in the Care Plan section) Acute Rehab PT Goals Patient Stated Goal: be able to walk PT Goal Formulation: With family Time For Goal Achievement: 11/12/15 Potential to Achieve Goals: Fair    Frequency Min 3X/week   Barriers to discharge Decreased caregiver support      Co-evaluation               End of Session   Activity Tolerance: Patient tolerated treatment well Patient left: in chair;with call bell/phone within reach;with chair alarm set;with nursing/sitter in room Nurse Communication: Mobility status;Precautions;Weight bearing status         Time: 0805-0821 PT Time Calculation (min) (ACUTE ONLY): 16 min   Charges:   PT Evaluation $PT Eval Moderate Complexity: 1 Procedure     PT G CodesMelford Aase 10/29/2015, 9:59 AM Elwyn Reach, [place]

## 2015-10-29 NOTE — Care Management Important Message (Signed)
Important Message  Patient Details  Name: Barbara Snyder MRN: [id number] Date of Birth: 11-16-1919   Medicare Important Message Given:  Yes    Cinda Hara P Gaspare Netzel 10/29/2015, 3:10 PM

## 2015-10-29 NOTE — Clinical Social Work Note (Signed)
Clinical Social Work Assessment  Patient Details  Name: BRIANICA ASEL MRN: [id number] Date of Birth: [birth year]  Date of referral:  10/29/15               Reason for consult:  Facility Placement                Permission sought to share information with:  Family Supports, Customer service manager Permission granted to share information::  Yes, Verbal Permission Granted  Name::     Francile, [place] [phone] or [phone]  Agency::  SNF admissions  Relationship::     Contact Information:     Housing/Transportation Living arrangements for the past 2 months:  Single Family Home Source of Information:  Adult Children Patient Interpreter Needed:  None Criminal Activity/Legal Involvement Pertinent to Current Situation/Hospitalization:  No - Comment as needed Significant Relationships:  Adult Children Lives with:  Adult Children Do you feel safe going back to the place where you live?  No (Patient needs some short term rehab before she can return back home.) Need for family participation in patient care:  Yes (Comment) (Patient has Alzheimer's and dementia)  Care giving concerns:  Patient's family feels she needs some short term rehab before she can return back home.  Social Worker assessment / plan: Patient is a 82 year old female who lives with her son.  Patient is alert to person, and has Alzheimer's Dementia.  CSW spoke with patient's son to complete assessment due to patient's hard of hearing and dementia.  Patient's son is the primary decision maker and also stated that patient has not been to SNF in the past.  Patient's son asked questions about how insurance pays for SNF placement and what patient can expect from short term rehab.  Patient's son states that patient has moved in with her son due to advanced dementia.  Patient has history of falling due to imbalance.  Patient's son asked questions regarding Medicaid and long term care placement.  CSW explained what  patient would have to do in order to apply for long term care Medicaid if she needs to stay at a SNF permanently.  Patient's son expressed that he felt his questions were answered appropriately and did not have any other questions.  Employment status:  Retired Nurse, adult PT Recommendations:  [place] / Referral to community resources:  [place]  Patient/Family's Response to care:  Patient's son in agreement to going to SNF for short term rehab.  Patient/Family's Understanding of and Emotional Response to Diagnosis, Current Treatment, and Prognosis:  Patient's son had questions about diagnosis, current treatment plan and prognosis, but feels he understands the process now.  Patient son did not have any other questions regarding discharge planning.  Emotional Assessment Appearance:  Appears stated age Attitude/Demeanor/Rapport:    Affect (typically observed):  Appropriate, Calm, Stable Orientation:  Oriented to Self Alcohol / Substance use:  Not Applicable Psych involvement (Current and /or in the community):  No (Comment)  Discharge Needs  Concerns to be addressed:  No discharge needs identified Readmission within the last 30 days:  No Current discharge risk:  None Barriers to Discharge:  Insurance Authorization   Anell Barr 10/29/2015, 6:08 PM

## 2015-10-29 NOTE — Progress Notes (Signed)
Patient ID: Barbara Snyder, female   DOB: September 23, 1919, 82 y.o.   MRN: [id number]    Subjective:  Denies SSCP, palpitations or Dyspnea   Objective:  Filed Vitals:   10/28/15 1700 10/28/15 1830 10/28/15 2002 10/29/15 0514  BP: 100/57 111/61 103/56 116/61  Pulse: 110 114 115 92  Temp:  99.4 F (37.4 C) 98.7 F (37.1 C) 98.4 F (36.9 C)  TempSrc:  Oral Oral Oral  Resp: 23 20 20 18   Weight:      SpO2: 94% 95% 92% 94%    Intake/Output from previous day:  Intake/Output Summary (Last 24 hours) at 10/29/15 0810 Last data filed at 10/29/15 [id number]  Gross per 24 hour  Intake   2375 ml  Output    780 ml  Net   1595 ml    Physical Exam: Advanced dementia  Frail chronically ill white female  HEENT: normal Neck supple with no adenopathy JVP normal no bruits no thyromegaly Lungs clear with no wheezing and good diaphragmatic motion Heart:  S1/S2 no murmur, no rub, gallop or click PMI normal Abdomen: benighn, BS positve, no tenderness, no AAA no bruit.  No HSM or HJR Distal pulses intact with no bruits No edema Neuro non-focal Skin breakdown buttocks Post right hip fracture    Lab Results: Basic Metabolic Panel:  Recent Labs  10/27/15 0418 10/27/15 1854 10/29/15 0634  NA 140  --  145  K 3.7  --  3.2*  CL 104  --  112*  CO2 25  --  25  GLUCOSE 118*  --  105*  BUN 35*  --  16  CREATININE 1.22*  --  0.71  CALCIUM 8.5*  --  8.1*  MG  --  2.4  --    CBC:  Recent Labs  10/28/15 1513 10/29/15 0634  WBC 12.3* 13.7*  HGB 7.6* 8.1*  HCT 22.8* 24.4*  MCV 95.4 95.3  PLT 81* 80*   Cardiac Enzymes:  Recent Labs  10/27/15 1854 10/28/15 0054  TROPONINI 0.07* 0.08*    Imaging: Pelvis Portable  10/28/2015  CLINICAL DATA:  Postop IM nail placement EXAM: PORTABLE PELVIS 1-2 VIEWS COMPARISON:  Portable exam 1202 hours compared to intraoperative images of 10/28/2015 FINDINGS: Distal extent of nail not imaged. IM nail with compression screw at proximal femur across the  intertrochanteric fracture. No femoral head dislocation. Hip joint spaces symmetric and preserved. Bones demineralized. Extensive atherosclerotic calcifications. IMPRESSION: IM nail with compression screw at proximal RIGHT femur post ORIF. Osseous demineralization without additional fracture dislocation. Electronically Signed   By: Lavonia Dana M.D.   On: 10/28/2015 12:32   Dg Chest Port 1 View  10/27/2015  CLINICAL DATA:  Aspiration pneumonia. EXAM: PORTABLE CHEST 1 VIEW COMPARISON:  Chest x-ray dated 10/25/2015 and chest x-ray dated 08/14/2015. FINDINGS: New dense opacity at the right lung base consistent with the given history of aspiration and/or pneumonia. Left lung remains clear. Cardiomediastinal silhouette appears stable in size and configuration. Extensive atherosclerotic calcifications again noted along the course of the thoracic aorta. Degenerative changes are seen throughout the scoliotic thoracolumbar spine. No acute- appearing osseous abnormality. IMPRESSION: New dense opacity at the right lung base consistent with the given history of aspiration and/or pneumonia. Electronically Signed   By: Franki Cabot M.D.   On: 10/27/2015 20:31   Dg C-arm 61-120 Min  10/28/2015  CLINICAL DATA:  Right femur IM nail EXAM: DG C-ARM 61-120 MIN; RIGHT FEMUR 2 VIEWS COMPARISON:  10/25/2015 FINDINGS: Four images from  C-arm radiography shows a IM nail reducing the proximal right femur fracture. The hardware components and fracture fragments are in anatomic alignment. No complications. IMPRESSION: 1. Status post ORIF of proximal femur fracture. Electronically Signed   By: Kerby Moors M.D.   On: 10/28/2015 10:47   Dg Femur, Min 2 Views Right  10/28/2015  CLINICAL DATA:  Right femur IM nail EXAM: DG C-ARM 61-120 MIN; RIGHT FEMUR 2 VIEWS COMPARISON:  10/25/2015 FINDINGS: Four images from C-arm radiography shows a IM nail reducing the proximal right femur fracture. The hardware components and fracture fragments are in  anatomic alignment. No complications. IMPRESSION: 1. Status post ORIF of proximal femur fracture. Electronically Signed   By: Kerby Moors M.D.   On: 10/28/2015 10:47   Dg Femur Port, Min 2 Views Right  10/28/2015  CLINICAL DATA:  Postop femur fixation. EXAM: RIGHT FEMUR PORTABLE 1 VIEW COMPARISON:  Earlier today at 959 hours. FINDINGS: Vascular calcifications. Intra medullary rod with proximal and distal fixation devices across the femur. No acute hardware complication. Intertrochanteric femur fracture again identified, status post reduction. IMPRESSION: Interval fixation of intertrochanteric right femur fracture. Electronically Signed   By: Abigail Miyamoto M.D.   On: 10/28/2015 13:09    Cardiac Studies:  ECG:   PAF rate 137   Telemetry:  ST  PAC;s  10/29/2015   Echo:   Medications:   . antiseptic oral rinse  7 mL Mouth Rinse q12n4p  . chlorhexidine  15 mL Mouth Rinse BID  . metoprolol  2.5 mg Intravenous 4 times per day  . pneumococcal 23 valent vaccine  0.5 mL Intramuscular Tomorrow-1000  . potassium chloride  40 mEq Oral Once     . dextrose 5 % and 0.9% NaCl 70 mL/hr at 10/28/15 1100    Assessment/Plan:   ST:  PO beta blocker she appears to be on soft solid diet.   Hip:  Routine post op care  No anticoagulation for PAF given age and advanced dementia  Ok to go to SNF  Jenkins Rouge 10/29/2015, 8:10 AM

## 2015-10-29 NOTE — Progress Notes (Signed)
[facility] PROGRESS NOTE  Barbara Snyder [id number] DOB: 21-Apr-1920 DOA: 10/25/2015 PCP: Asencion Noble, MD  HPI/Brief narrative 82 year old female with history of CHF, coronary artery disease, spinal stenosis, multiple falls present and with fall and subsequent right hip pain. Patient found an old C2 fracture and currently an Aspen collar. Patient was transferred from [facility]. Orthopedic surgery consulted, plan for surgery this afternoon.  Assessment/Plan: Right intertrochanteric femur fracture status post fall -X-ray of the right hip showed comminuted intertrochanteric femur fracture on the right with marked varus angulation -Orthopedic surgery consulted -Given patient's age as well as comorbidities, she is high risk for surgery -Seen by Cardiology - again, considered high risk for surgery -Patient did undergo Cephalomedullary fixation of R pertrochanteric femur fracture  History of C2 fracture -Occurred in 2016 -Remains with Aspen collar  Leukocytosis -Likely reactive -UA and chest x-ray unremarkable  Hyperglycemia -Hemoglobin A1c 5.5 -Will continue to monitor CBGs  Alzheimer's dementia -Currently holding dementia medications - will continue to monitor closely   History of CHF -Echocardiogram 12/03/2009 showed an EF of 75%, no regional wall motion abnormalities -Currently euvolemic, continue to monitor closely -Monitor intake and output, daily weight -On Lasix and spironolactone held -Continue metoprolol as tolerated  Tachycardia -Seen by Cardiology -Continue beta blocker as tolerated -Per cardiology, if HR becomes difficult to control, consideration for loading with amiodarone -Stable thus far -No anticoagulation per Cardiology -OK for dispo per Cardiology  Code Status: DNR Family Communication: Pt in room Disposition Plan: Possible SNF after surgery   Consultants:  Orthopedic  Surgery  Cardiology  Procedures:    Antibiotics: Anti-infectives    Start     Dose/Rate Route Frequency Ordered Stop   10/28/15 1200  ceFAZolin (ANCEF) IVPB 2 g/50 mL premix     2 g 100 mL/hr over 30 Minutes Intravenous Every 6 hours 10/28/15 1117 10/28/15 1813   10/26/15 1800  ceFAZolin (ANCEF) IVPB 2 g/50 mL premix  Status:  Discontinued     2 g 100 mL/hr over 30 Minutes Intravenous To [facility] 10/26/15 0805 10/26/15 1742      HPI/Subjective: Today, cannot determine given dementia  Objective: Filed Vitals:   10/29/15 0514 10/29/15 1050 10/29/15 1406 10/29/15 1428  BP: 116/61 116/61 116/61 115/71  Pulse: 92 92 92 120  Temp: 98.4 F (36.9 C)   98.4 F (36.9 C)  TempSrc: Oral   Oral  Resp: 18   18  Weight:      SpO2: 94%   93%    Intake/Output Summary (Last 24 hours) at 10/29/15 1646 Last data filed at 10/29/15 0514  Gross per 24 hour  Intake     70 ml  Output    330 ml  Net   -260 ml   Filed Weights   10/25/15 1051 10/26/15 0701 10/27/15 0459  Weight: 29.484 kg (65 lb) 34.2 kg (75 lb 6.4 oz) 31.9 kg (70 lb 5.2 oz)    Exam:   General:  Awake, laying in bed, in nad, confused, awake, nonverbal  Cardiovascular: regular, s1, s2  Respiratory: normal resp effort, no wheezing  Abdomen: soft, nondistended, pos BS  Musculoskeletal: perfused, no clubbing  Data Reviewed: Basic Metabolic Panel:  Recent Labs Lab 10/25/15 1155 10/26/15 1132 10/27/15 0418 10/27/15 1854 10/28/15 0816 10/29/15 0634  NA 134* 136 140  --  142 145  K 4.5 4.3 3.7  --  3.9 3.2*  CL 95* 99* 104  --   --  112*  CO2 30 23  25  --   --  25  GLUCOSE 175* 165* 118*  --  102* 105*  BUN 17 31* 35*  --   --  16  CREATININE 0.74 1.64* 1.22*  --   --  0.71  CALCIUM 8.7* 8.5* 8.5*  --   --  8.1*  MG  --   --   --  2.4  --   --    Liver Function Tests:  Recent Labs Lab 10/25/15 1155  AST 30  ALT 15  ALKPHOS 90  BILITOT 0.7  PROT 6.5  ALBUMIN 3.3*   No results for  input(s): LIPASE, AMYLASE in the last 168 hours. No results for input(s): AMMONIA in the last 168 hours. CBC:  Recent Labs Lab 10/25/15 1155 10/26/15 1132 10/27/15 0418 10/28/15 0816 10/28/15 1513 10/29/15 0634  WBC 17.3* 13.9* 13.0*  --  12.3* 13.7*  NEUTROABS 15.6*  --   --   --   --   --   HGB 11.2* 9.4* 8.3* 7.8* 7.6* 8.1*  HCT 33.0* 26.9* 25.1* 23.0* 22.8* 24.4*  MCV 98.8 96.4 97.3  --  95.4 95.3  PLT 272 198 182  --  81* 80*   Cardiac Enzymes:  Recent Labs Lab 10/27/15 1854 10/28/15 0054  TROPONINI 0.07* 0.08*   BNP (last 3 results) No results for input(s): BNP in the last 8760 hours.  ProBNP (last 3 results) No results for input(s): PROBNP in the last 8760 hours.  CBG:  Recent Labs Lab 10/26/15 1156 10/29/15 0118 10/29/15 0511 10/29/15 1216  GLUCAP 145* 96 95 104*    Recent Results (from the past 240 hour(s))  Urine culture     Status: None   Collection Time: 10/25/15 11:50 AM  Result Value Ref Range Status   Specimen Description URINE, CATHETERIZED  Final   Special Requests NONE  Final   Culture   Final    NO GROWTH 1 DAY Performed at [facility]    Report Status 10/26/2015 FINAL  Final  Surgical pcr screen     Status: Abnormal   Collection Time: 10/26/15 11:28 AM  Result Value Ref Range Status   MRSA, PCR NEGATIVE NEGATIVE Final   Staphylococcus aureus POSITIVE (A) NEGATIVE Final    Comment:        The Xpert SA Assay (FDA approved for NASAL specimens in patients over 33 years of age), is one component of a comprehensive surveillance program.  Test performance has been validated by [facility] for patients greater than or equal to 70 year old. It is not intended to diagnose infection nor to guide or monitor treatment.      Studies: Pelvis Portable  10/28/2015  CLINICAL DATA:  Postop IM nail placement EXAM: PORTABLE PELVIS 1-2 VIEWS COMPARISON:  Portable exam 1202 hours compared to intraoperative images of 10/28/2015 FINDINGS:  Distal extent of nail not imaged. IM nail with compression screw at proximal femur across the intertrochanteric fracture. No femoral head dislocation. Hip joint spaces symmetric and preserved. Bones demineralized. Extensive atherosclerotic calcifications. IMPRESSION: IM nail with compression screw at proximal RIGHT femur post ORIF. Osseous demineralization without additional fracture dislocation. Electronically Signed   By: Lavonia Dana M.D.   On: 10/28/2015 12:32   Dg Chest Port 1 View  10/27/2015  CLINICAL DATA:  Aspiration pneumonia. EXAM: PORTABLE CHEST 1 VIEW COMPARISON:  Chest x-ray dated 10/25/2015 and chest x-ray dated 08/14/2015. FINDINGS: New dense opacity at the right lung base consistent with the given history of  aspiration and/or pneumonia. Left lung remains clear. Cardiomediastinal silhouette appears stable in size and configuration. Extensive atherosclerotic calcifications again noted along the course of the thoracic aorta. Degenerative changes are seen throughout the scoliotic thoracolumbar spine. No acute- appearing osseous abnormality. IMPRESSION: New dense opacity at the right lung base consistent with the given history of aspiration and/or pneumonia. Electronically Signed   By: Franki Cabot M.D.   On: 10/27/2015 20:31   Dg C-arm 61-120 Min  10/28/2015  CLINICAL DATA:  Right femur IM nail EXAM: DG C-ARM 61-120 MIN; RIGHT FEMUR 2 VIEWS COMPARISON:  10/25/2015 FINDINGS: Four images from C-arm radiography shows a IM nail reducing the proximal right femur fracture. The hardware components and fracture fragments are in anatomic alignment. No complications. IMPRESSION: 1. Status post ORIF of proximal femur fracture. Electronically Signed   By: Kerby Moors M.D.   On: 10/28/2015 10:47   Dg Femur, Min 2 Views Right  10/28/2015  CLINICAL DATA:  Right femur IM nail EXAM: DG C-ARM 61-120 MIN; RIGHT FEMUR 2 VIEWS COMPARISON:  10/25/2015 FINDINGS: Four images from C-arm radiography shows a IM nail  reducing the proximal right femur fracture. The hardware components and fracture fragments are in anatomic alignment. No complications. IMPRESSION: 1. Status post ORIF of proximal femur fracture. Electronically Signed   By: Kerby Moors M.D.   On: 10/28/2015 10:47   Dg Femur Port, Min 2 Views Right  10/28/2015  CLINICAL DATA:  Postop femur fixation. EXAM: RIGHT FEMUR PORTABLE 1 VIEW COMPARISON:  Earlier today at 959 hours. FINDINGS: Vascular calcifications. Intra medullary rod with proximal and distal fixation devices across the femur. No acute hardware complication. Intertrochanteric femur fracture again identified, status post reduction. IMPRESSION: Interval fixation of intertrochanteric right femur fracture. Electronically Signed   By: Abigail Miyamoto M.D.   On: 10/28/2015 13:09    Scheduled Meds: . antiseptic oral rinse  7 mL Mouth Rinse q12n4p  . chlorhexidine  15 mL Mouth Rinse BID  . metoprolol tartrate  25 mg Oral 3 times per day   Continuous Infusions: . dextrose 5 % and 0.9% NaCl 70 mL/hr at 10/28/15 1100    Principal Problem:   Intertrochanteric fracture of right femur (HCC) Active Problems:   Multiple falls   Multiple skin tears   CAD (coronary artery disease)   Hypertrophic cardiomyopathy ([place])   Essential hypertension   Alzheimer's dementia without behavioral disturbance   Leukocytosis   Hyperglycemia   Pertrochanteric fracture of right femur ([place])   Tachycardia   [facility], [place] [facility] Pager [phone]. If 7PM-7AM, please contact night-coverage at [email or web address], password [facility] 10/29/2015, 4:46 PM  LOS: 4 days

## 2015-10-29 NOTE — Evaluation (Signed)
Occupational Therapy Evaluation Patient Details Name: Barbara Snyder MRN: [id number] DOB: [birth year] Today's Date: 10/29/2015    History of Present Illness 82 yo admitted after fall at home with right femur fx s/p IM nail. PMHx: C2 fx 2016, Alzheimers, CHF, CAD, spinal stenosis   Clinical Impression   This 82 yo female admitted and underwent above presents to acute OT with deficits below affecting her ability to help care for herself. She will benefit from acute OT with follow up OT at SNF to get more mobile to A with basic ADLs.    Follow Up Recommendations  SNF    Equipment Recommendations   (TBD at next venue)       Precautions / Restrictions Precautions Precautions: Fall Required Braces or Orthoses: Cervical Brace Cervical Brace: At all times;Soft collar Restrictions Weight Bearing Restrictions: Yes RLE Weight Bearing: Partial weight bearing RLE Partial Weight Bearing Percentage or Pounds: 30%      Mobility Bed Mobility Overal bed mobility: Needs Assistance Bed Mobility: Supine to Sit;Sit to Supine     Supine to sit: Total assist Sit to supine: Total assist      Transfers Overall transfer level: Needs assistance   Transfers: Squat Pivot Transfers     Squat pivot transfers: Total assist     General transfer comment: pt unable to assist with scooting to EOB    Balance Overall balance assessment: Needs assistance Sitting-balance support: Feet supported;Bilateral upper extremity supported Sitting balance-Leahy Scale: Poor       Standing balance-Leahy Scale: Zero Standing balance comment: Cannot stand without support                            ADL Overall ADL's : Needs assistance/impaired                                       General ADL Comments: total A for all basic ADLs at bed level. Did transfer pt to [facility] with +2 total A (squat pivot transfer)               Pertinent Vitals/Pain Pain Assessment:  (PAINAD=3)         Extremity/Trunk Assessment Upper Extremity Assessment Upper Extremity Assessment: Generalized weakness;RUE deficits/detail;LUE deficits/detail RUE Deficits / Details: decreased A/PROM of shoulders; hands and lower arms edematous RUE Coordination: decreased fine motor;decreased gross motor LUE Deficits / Details: decreased A/PROM of shoulders; hands and lower arms edematous LUE Coordination: decreased fine motor;decreased gross motor           Communication Communication Communication: HOH (no hearing aids)   Cognition Arousal/Alertness: Awake/alert Behavior During Therapy: WFL for tasks assessed/performed Overall Cognitive Status: History of cognitive impairments - at baseline (per chart)                                Home Living Family/patient expects to be discharged to:: Private residence   Available Help at Discharge: Available 24 hours/day Type of Home: House       Home Layout: One level     Bathroom Shower/Tub: Occupational psychologist: Standard     Home Equipment: Environmental consultant - 2 wheels;Wheelchair - manual;Bedside commode          Prior Functioning/Environment Level of Independence: Needs assistance  Gait / Transfers Assistance Needed: pt was  transferring OOB to [facility] on her own and walking in the house with RW grossly 30' ADL's / Homemaking Assistance Needed: son performed max assist for bathing and dressing as well as assist onto shower seat        OT Diagnosis: Generalized weakness;Cognitive deficits;Acute pain   OT Problem List: Decreased strength;Decreased range of motion;Decreased activity tolerance;Impaired balance (sitting and/or standing);Pain;Decreased cognition;Decreased knowledge of use of DME or AE   OT Treatment/Interventions: Self-care/ADL training;Patient/family education;Balance training;Therapeutic activities    OT Goals(Current goals can be found in the care plan section) Acute Rehab OT Goals Patient Stated  Goal: to get up and go to bathroom OT Goal Formulation: Patient unable to participate in goal setting Time For Goal Achievement: 11/05/15 Potential to Achieve Goals: Fair  OT Frequency: Min 1X/week              End of Session Nurse Communication:  (NT: pt needed to be cleaned up post bowel movement)  Activity Tolerance: Patient limited by fatigue;Patient limited by pain Patient left: in bed;with call bell/phone within reach;with bed alarm set   Time: [id number] OT Time Calculation (min): 24 min Charges:  OT General Charges $OT Visit: 1 Procedure OT Evaluation $OT Eval Moderate Complexity: 1 Procedure OT Treatments $Self Care/Home Management : 8-22 mins  Almon Register [id number] 10/29/2015, 4:07 PM

## 2015-10-29 NOTE — Progress Notes (Signed)
Orthopedic Tech Progress Note Patient Details:  Barbara Snyder 1920-08-19 [id number]  Patient ID: Barbara Snyder, female   DOB: 12/14/1919, 82 y.o.   MRN: [id number]   [name] bar 10/29/2015, 12:53 PM

## 2015-10-29 NOTE — Clinical Social Work Placement (Signed)
   CLINICAL SOCIAL WORK PLACEMENT  NOTE  Date:  10/29/2015  Patient Details  Name: Barbara Snyder MRN: [id number] Date of Birth: Mar 01, 1920  Clinical Social Work is seeking post-discharge placement for this patient at the [place] level of care (*CSW will initial, date and re-position this form in  chart as items are completed):  Yes   Patient/family provided with [facility] Work Department's list of facilities offering this level of care within the geographic area requested by the patient (or if unable, by the patient's family).  Yes   Patient/family informed of their freedom to choose among providers that offer the needed level of care, that participate in Medicare, Medicaid or managed care program needed by the patient, have an available bed and are willing to accept the patient.  Yes   Patient/family informed of [facility]'s ownership interest in [facility] and [facility], as well as of the fact that they are under no obligation to receive care at these facilities.  PASRR submitted to EDS on 10/29/15     PASRR number received on 10/29/15     Existing PASRR number confirmed on       FL2 transmitted to all facilities in geographic area requested by pt/family on 10/29/15     FL2 transmitted to all facilities within larger geographic area on       Patient informed that his/her managed care company has contracts with or will negotiate with certain facilities, including the following:            Patient/family informed of bed offers received.  Patient chooses bed at       Physician recommends and patient chooses bed at      Patient to be transferred to   on  .  Patient to be transferred to facility by       Patient family notified on   of transfer.  Name of family member notified:        PHYSICIAN Please sign FL2     Additional Comment:    _______________________________________________ Ross Ludwig, LCSWA 10/29/2015,  6:03 PM

## 2015-10-29 NOTE — Care Management Note (Signed)
Case Management Note  Patient Details  Name: Barbara Snyder MRN: [id number] Date of Birth: [birth year]  Subjective/Objective:      Admitted with right femur fracture, s/p IM nail intertrochantric 10/28/15              Action/Plan: PT recommended SNF, referral made to [facility]. Will continue to follow.  Expected Discharge Date:                  Expected Discharge Plan:  Skilled Nursing Facility  In-House Referral:  Clinical Social Work  Discharge planning Services  CM Consult  Post Acute Care Choice:  NA Choice offered to:     DME Arranged:    DME Agency:     HH Arranged:    [place] Agency:     Status of Service:  In process, will continue to follow  Medicare Important Message Given:    Date Medicare IM Given:    Medicare IM give by:    Date Additional Medicare IM Given:    Additional Medicare Important Message give by:     If discussed at [place] of Stay Meetings, dates discussed:    Additional Comments:  Nila Nephew, RN 10/29/2015, 2:30 PM

## 2015-10-29 NOTE — NC FL2 (Signed)
[place] LEVEL OF CARE SCREENING TOOL     IDENTIFICATION  Patient Name: Barbara Snyder Birthdate: 1919-11-04 Sex: female Admission Date (Current Location): 10/25/2015  [facility] and [place] Number:  [employer] and Address:   [place] [address], [place], [address]      Provider Number: [id number]  Attending Physician Name and Address:  Donne Hazel, MD  Relative Name and Phone Number:  [place], [place] [phone] or [phone]    Current Level of Care: Hospital Recommended Level of Care: [place] Prior Approval Number:    Date Approved/Denied:   PASRR Number: [id number]  Discharge Plan: SNF    Current Diagnoses: Patient Active Problem List   Diagnosis Date Noted  . Tachycardia 10/29/2015  . Dementia   . Pertrochanteric fracture of right femur ([place]) 10/28/2015  . Intertrochanteric fracture of right femur ([place]) 10/25/2015  . Multiple falls 10/25/2015  . Multiple skin tears 10/25/2015  . CAD (coronary artery disease) 10/25/2015  . Hypertrophic cardiomyopathy ([place]) 10/25/2015  . Essential hypertension 10/25/2015  . Alzheimer's dementia without behavioral disturbance 10/25/2015  . Leukocytosis 10/25/2015  . Hyperglycemia 10/25/2015  . Rectal bleeding 09/19/2011  . Hematochezia 06/16/2011  . SPONDYLOSIS 01/14/2010  . SPINAL STENOSIS 01/14/2010  . LOW BACK PAIN 01/14/2010    Orientation RESPIRATION BLADDER Height & Weight     Self  Normal Continent Weight: 70 lb 5.2 oz (31.9 kg) Height:     BEHAVIORAL SYMPTOMS/MOOD NEUROLOGICAL BOWEL NUTRITION STATUS      Continent Diet (Regular)  AMBULATORY STATUS COMMUNICATION OF NEEDS Skin   Limited Assist Verbally Surgical wounds                       Personal Care Assistance Level of Assistance  Bathing, Dressing Bathing Assistance: Limited assistance   Dressing Assistance: Limited assistance     Functional Limitations Info  Hearing   Hearing  Info: Impaired      SPECIAL CARE FACTORS FREQUENCY  PT (By licensed PT)     PT Frequency: 5x a week              Contractures      Additional Factors Info  Code Status Code Status Info: DNR             Current Medications (10/29/2015):  This is the current hospital active medication list Current Facility-Administered Medications  Medication Dose Route Frequency Provider Last Rate Last Dose  . acetaminophen (TYLENOL) tablet 650 mg  650 mg Oral Q6H PRN Rod Can, MD   650 mg at 10/29/15 1405   Or  . acetaminophen (TYLENOL) suppository 650 mg  650 mg Rectal Q6H PRN Rod Can, MD      . albuterol (PROVENTIL) (2.5 MG/3ML) 0.083% nebulizer solution 2.5 mg  2.5 mg Nebulization Q2H PRN Rexene Alberts, MD      . antiseptic oral rinse (CPC / CETYLPYRIDINIUM CHLORIDE 0.05%) solution 7 mL  7 mL Mouth Rinse q12n4p Donne Hazel, MD   7 mL at 10/29/15 1200  . chlorhexidine (PERIDEX) 0.12 % solution 15 mL  15 mL Mouth Rinse BID Donne Hazel, MD   15 mL at 10/29/15 1011  . dextrose 5 %-0.9 % sodium chloride infusion   Intravenous Continuous Rexene Alberts, MD 70 mL/hr at 10/28/15 1100    . fentaNYL (SUBLIMAZE) injection 12.5-25 mcg  12.5-25 mcg Intravenous Q2H PRN Marijean Heath, NP   25 mcg at 10/28/15 2154  .  menthol-cetylpyridinium (CEPACOL) lozenge 3 mg  1 lozenge Oral PRN Rod Can, MD       Or  . phenol (CHLORASEPTIC) mouth spray 1 spray  1 spray Mouth/Throat PRN Rod Can, MD      . metoCLOPramide (REGLAN) tablet 5-10 mg  5-10 mg Oral Q8H PRN Rod Can, MD       Or  . metoCLOPramide (REGLAN) injection 5-10 mg  5-10 mg Intravenous Q8H PRN Rod Can, MD      . metoprolol tartrate (LOPRESSOR) tablet 25 mg  25 mg Oral 3 times per day Josue Hector, MD   25 mg at 10/29/15 1406  . ondansetron (ZOFRAN) tablet 4 mg  4 mg Oral Q6H PRN Rod Can, MD       Or  . ondansetron (ZOFRAN) injection 4 mg  4 mg Intravenous Q6H PRN Rod Can, MD          Discharge Medications: Please see discharge summary for a list of discharge medications.  Relevant Imaging Results:  Relevant Lab Results:   Additional Information SSN [id number]  Ross Ludwig, [place]

## 2015-10-29 NOTE — Progress Notes (Signed)
   Subjective:  Patient reports pain as mild to moderate.  No c/o.  Objective:   VITALS:   Filed Vitals:   10/28/15 1700 10/28/15 1830 10/28/15 2002 10/29/15 0514  BP: 100/57 111/61 103/56 116/61  Pulse: 110 114 115 92  Temp:  99.4 F (37.4 C) 98.7 F (37.1 C) 98.4 F (36.9 C)  TempSrc:  Oral Oral Oral  Resp: 23 20 20 18   Weight:      SpO2: 94% 95% 92% 94%    ABD soft Intact pulses distally Dorsiflexion/Plantar flexion intact Incision: dressing C/D/I Compartment soft   Lab Results  Component Value Date   WBC 13.7* 10/29/2015   HGB 8.1* 10/29/2015   HCT 24.4* 10/29/2015   MCV 95.3 10/29/2015   PLT 80* 10/29/2015   BMET    Component Value Date/Time   NA 145 10/29/2015 0634   K 3.2* 10/29/2015 0634   CL 112* 10/29/2015 0634   CO2 25 10/29/2015 0634   GLUCOSE 105* 10/29/2015 0634   BUN 16 10/29/2015 0634   CREATININE 0.71 10/29/2015 0634   CALCIUM 8.1* 10/29/2015 0634   GFRNONAA >60 10/29/2015 0634   GFRAA >60 10/29/2015 0634     Assessment/Plan: 1 Day Post-Op   Principal Problem:   Intertrochanteric fracture of right femur (HCC) Active Problems:   Multiple falls   Multiple skin tears   CAD (coronary artery disease)   Hypertrophic cardiomyopathy ([place])   Essential hypertension   Alzheimer's dementia without behavioral disturbance   Leukocytosis   Hyperglycemia   Pertrochanteric fracture of right femur ([place])    TDWB RLE with walker PT/OT for bed --> chair xfers DVT ppx: high risk for bleeding due to multiple falls, has been advised against chemical DVT ppx in the past PO pain control Dispo: SNF placement   Hareem Surowiec, Horald Pollen 10/29/2015, 7:32 AM   Rod Can, MD Cell [phone]

## 2015-10-30 ENCOUNTER — Encounter (HOSPITAL_COMMUNITY): Payer: Self-pay | Admitting: Orthopedic Surgery

## 2015-10-30 DIAGNOSIS — D72829 Elevated white blood cell count, unspecified: Secondary | ICD-10-CM

## 2015-10-30 LAB — CBC
HCT: 24.1 % — ABNORMAL LOW (ref 36.0–46.0)
HEMOGLOBIN: 7.9 g/dL — AB (ref 12.0–15.0)
MCH: 31.2 pg (ref 26.0–34.0)
MCHC: 32.8 g/dL (ref 30.0–36.0)
MCV: 95.3 fL (ref 78.0–100.0)
Platelets: 94 10*3/uL — ABNORMAL LOW (ref 150–400)
RBC: 2.53 MIL/uL — ABNORMAL LOW (ref 3.87–5.11)
RDW: 16.7 % — AB (ref 11.5–15.5)
WBC: 13.4 10*3/uL — ABNORMAL HIGH (ref 4.0–10.5)

## 2015-10-30 LAB — BASIC METABOLIC PANEL
Anion gap: 11 (ref 5–15)
BUN: 23 mg/dL — ABNORMAL HIGH (ref 6–20)
CHLORIDE: 114 mmol/L — AB (ref 101–111)
CO2: 24 mmol/L (ref 22–32)
Calcium: 8.4 mg/dL — ABNORMAL LOW (ref 8.9–10.3)
Creatinine, Ser: 0.77 mg/dL (ref 0.44–1.00)
GFR calc Af Amer: >60 mL/min (ref >60)
GFR calc non Af Amer: >60 mL/min (ref >60)
GLUCOSE: 105 mg/dL — AB (ref 65–99)
Potassium: 3.3 mmol/L — ABNORMAL LOW (ref 3.5–5.1)
SODIUM: 149 mmol/L — AB (ref 135–145)

## 2015-10-30 LAB — GLUCOSE, CAPILLARY: Glucose-Capillary: 89 mg/dL (ref 65–99)

## 2015-10-30 MED ORDER — METOPROLOL TARTRATE 50 MG PO TABS: 50.0000 mg | ORAL_TABLET | Freq: Three times a day (TID) | ORAL | Status: AC

## 2015-10-30 MED ORDER — METOPROLOL TARTRATE 50 MG PO TABS
50.0000 mg | ORAL_TABLET | Freq: Three times a day (TID) | ORAL | Status: DC
Start: 2015-10-30 — End: 2015-10-31
  Administered 2015-10-30: 50 mg via ORAL
  Filled 2015-10-30: qty 1

## 2015-10-30 MED ORDER — POTASSIUM CHLORIDE CRYS ER 20 MEQ PO TBCR
40.0000 meq | EXTENDED_RELEASE_TABLET | Freq: Once | ORAL | Status: AC
  Administered 2015-10-30: 40 meq via ORAL
  Filled 2015-10-30: qty 2

## 2015-10-30 NOTE — Clinical Social Work Note (Signed)
Patient has a bed at [facility]. BSW intern has made patient's son, Barbara Snyder aware of discharge disposition. BSW intern has also confirmed patient's d/c plan with [facility]ib[facility] Billings of facility. Patient to d/c to SNF once medically stable.  BSW intern remains available.  [place] intern [phone]

## 2015-10-30 NOTE — Progress Notes (Signed)
   Subjective:  Patient reports pain as mild to moderate.  No c/o.  Objective:   VITALS:   Filed Vitals:   10/29/15 1406 10/29/15 1428 10/29/15 2035 10/30/15 0700  BP: 116/61 115/71 91/46 130/71  Pulse: 92 120 107 68  Temp:  98.4 F (36.9 C) 98.8 F (37.1 C) 98.4 F (36.9 C)  TempSrc:  Oral Oral   Resp:  18 17 16   Weight:      SpO2:  93% 91% 93%    ABD soft Intact pulses distally Dorsiflexion/Plantar flexion intact Incision: dressing C/D/I Compartment soft   Lab Results  Component Value Date   WBC 13.4* 10/30/2015   HGB 7.9* 10/30/2015   HCT 24.1* 10/30/2015   MCV 95.3 10/30/2015   PLT 94* 10/30/2015   BMET    Component Value Date/Time   NA 149* 10/30/2015 0531   K 3.3* 10/30/2015 0531   CL 114* 10/30/2015 0531   CO2 24 10/30/2015 0531   GLUCOSE 105* 10/30/2015 0531   BUN 23* 10/30/2015 0531   CREATININE 0.77 10/30/2015 0531   CALCIUM 8.4* 10/30/2015 0531   GFRNONAA >60 10/30/2015 0531   GFRAA >60 10/30/2015 0531     Assessment/Plan: 2 Days Post-Op   Principal Problem:   Intertrochanteric fracture of right femur (HCC) Active Problems:   Multiple falls   Multiple skin tears   CAD (coronary artery disease)   Hypertrophic cardiomyopathy (HCC)   Essential hypertension   Alzheimer's dementia without behavioral disturbance   Leukocytosis   Hyperglycemia   Pertrochanteric fracture of right femur (HCC)   Tachycardia   Dementia    TDWB RLE with walker PT/OT for bed --> chair xfers DVT ppx: high risk for bleeding due to multiple falls, has been advised against chemical DVT ppx in the past PO pain control Dispo: SNF placement   Barbara Snyder, Horald Pollen 10/30/2015, 7:36 AM   Rod Can, MD Cell [phone]

## 2015-10-30 NOTE — Discharge Summary (Signed)
Physician Discharge Summary  Barbara Snyder [id number] DOB: [birth year] DOA: 10/25/2015  PCP: Asencion Noble, MD  Admit date: 10/25/2015 Discharge date: 10/30/2015  Time spent: 20 minutes  Recommendations for Outpatient Follow-up:  1. Follow up with PCP in 2-3 weeks 2. Follow up with Orthopedic Surgery as scheduled   Discharge Diagnoses:  Principal Problem:   Intertrochanteric fracture of right femur [facility]) Active Problems:   Multiple falls   Multiple skin tears   CAD (coronary artery disease)   Hypertrophic cardiomyopathy ([place])   Essential hypertension   Alzheimer's dementia without behavioral disturbance   Leukocytosis   Hyperglycemia   Pertrochanteric fracture of right femur (HCC)   Tachycardia   Dementia   Discharge Condition: Stable  Diet recommendation: Regular  Filed Weights   10/25/15 1051 10/26/15 0701 10/27/15 0459  Weight: 29.484 kg (65 lb) 34.2 kg (75 lb 6.4 oz) 31.9 kg (70 lb 5.2 oz)    History of present illness:  Please review dictated H and P from 3/2 for details. Briefly, 82 year old female with history of CHF, coronary artery disease, spinal stenosis, multiple falls present and with fall and subsequent right hip pain. Patient found an old C2 fracture and currently an Aspen collar. Patient was transferred from [facility]. Orthopedic surgery consulted, plan for surgery this afternoon.  Hospital Course:  Right intertrochanteric femur fracture status post fall -X-ray of the right hip showed comminuted intertrochanteric femur fracture on the right with marked varus angulation -Orthopedic surgery consulted -Given patient's age as well as comorbidities, she is high risk for surgery -Seen by Cardiology - again, considered high risk for surgery -Patient did undergo Cephalomedullary fixation of R pertrochanteric femur fracture  History of C2 fracture -Occurred in 2016 -Remains with Aspen collar  Leukocytosis -Likely reactive -UA and chest x-ray  unremarkable  Hyperglycemia -Hemoglobin A1c 5.5  Alzheimer's dementia - Held dementia medications while admitted - will continue to monitor closely   History of CHF -Echocardiogram 12/03/2009 showed an EF of 75%, no regional wall motion abnormalities -Currently euvolemic, continue to monitor closely -Monitor intake and output, daily weight -On Lasix and spironolactone prior to admit, held -Continue metoprolol as tolerated, dose increased per Cardiology -Plan to resume lasix on discharge  Tachycardia -Seen by Cardiology -Continue beta blocker as tolerated, dose increased by Cardiology -No anticoagulation per Cardiology -OK for dispo to SNF and OK to participate with PT per Cardiology   Procedures: Cephalomedullary fixation of R pertrochanteric femur fracture 3/5  Consultations:  Orthopedic Surgery  Cardiology  Discharge Exam: Filed Vitals:   10/29/15 1406 10/29/15 1428 10/29/15 2035 10/30/15 0700  BP: 116/61 115/71 91/46 130/71  Pulse: 92 120 107 68  Temp:  98.4 F (36.9 C) 98.8 F (37.1 C) 98.4 F (36.9 C)  TempSrc:  Oral Oral   Resp:  18 17 16   Weight:      SpO2:  93% 91% 93%    General: awake, in nad Cardiovascular: regular, s1, s2 Respiratory: normal resp effort, no wheezing  Discharge Instructions     Medication List    STOP taking these medications        spironolactone 25 MG tablet  Commonly known as:  ALDACTONE      TAKE these medications        acetaminophen 325 MG tablet  Commonly known as:  TYLENOL  Take 325 mg by mouth every 6 (six) hours as needed. For pain     ENSURE  Take 237 mLs by mouth daily.  furosemide 40 MG tablet  Commonly known as:  LASIX  Take 20 mg by mouth daily.     metoprolol 50 MG tablet  Commonly known as:  LOPRESSOR  Take 1 tablet (50 mg total) by mouth every 8 (eight) hours.     multivitamin with minerals tablet  Take 1 tablet by mouth daily.     sertraline 50 MG tablet  Commonly known as:  ZOLOFT   Take 50 mg by mouth daily.       No Known Allergies Follow-up Information    Follow up with Swinteck, Horald Pollen, MD. Schedule an appointment as soon as possible for a visit in 2 weeks.   Specialty:  Orthopedic Surgery   Why:  For wound re-check   Contact information:   [address] [address] [phone]       Follow up with [facility].   Specialty:  [place] information:   [address] [place] [phone]      Follow up with Asencion Noble, MD. Schedule an appointment as soon as possible for a visit in 3 weeks.   Specialty:  Internal Medicine   Why:  Hospital follow up   Contact information:   [address] [place] [address] [phone]        The results of significant diagnostics from this hospitalization (including imaging, microbiology, ancillary and laboratory) are listed below for reference.    Significant Diagnostic Studies: Dg Chest 1 View  10/25/2015  CLINICAL DATA:  Right hip pain status post fall this morning. Alzheimer's. Currently unable to walk. EXAM: CHEST 1 VIEW COMPARISON:  Chest x-rays dated 08/14/2015 and 04/15/2015. FINDINGS: The severe dextroscoliosis of the thoracic spine appears stable. Multiple chronic-appearing compression fracture deformities are again seen within the thoracic spine, difficult to definitively characterize due to the scoliosis and diffuse osteopenia, but all appear chronic. No acute-appearing osseous abnormality seen. Cardiomediastinal silhouette is stable in size and configuration. Extensive atherosclerotic calcifications again noted along the walls of the tortuous thoracic aorta. Lungs are clear. No pleural effusion seen. No pneumothorax. IMPRESSION: No acute findings. Lungs are clear and there is no evidence of acute cardiopulmonary abnormality. Chronic-appearing osseous findings detailed above. Electronically Signed   By: Franki Cabot M.D.   On: 10/25/2015 11:44   Dg Elbow Complete Left  10/10/2015  CLINICAL DATA:  82 year old female status post unwitnessed fall with left elbow aspiration EXAM: LEFT ELBOW - COMPLETE 3+ VIEW COMPARISON:  Concurrently obtained radiographs of the left Knee FINDINGS: No acute fracture or malalignment. No evidence of elbow joint effusion. Irregularity of the soft tissues posterior to the proximal forearm consistent with skin tear/ laceration. The bones are mildly osteopenic in appearance. IMPRESSION: No acute fracture or malalignment. Electronically Signed   By: Jacqulynn Cadet M.D.   On: 10/10/2015 20:27   Ct Head Wo Contrast  10/25/2015  CLINICAL DATA:  Fall EXAM: CT HEAD WITHOUT CONTRAST CT CERVICAL SPINE WITHOUT CONTRAST TECHNIQUE: Multidetector CT imaging of the head and cervical spine was performed following the standard protocol without intravenous contrast. Multiplanar CT image reconstructions of the cervical spine were also generated. COMPARISON:  10/10/2015 FINDINGS: CT HEAD FINDINGS Global atrophy. Chronic ischemic changes in the periventricular white matter. No mass effect, midline shift, or acute intracranial hemorrhage. There is trace fluid in the right mastoid air cells. Left mastoidectomy has been performed. No evidence of skull base or cranial fracture. CT CERVICAL SPINE FINDINGS  The chronic fracture through the neck of the odontoid is stable. There is persistent and stable retrolisthesis of the odontoid with respect to the C2 body. The posterior arch of C1 protrudes through the foramen magnum and this results then consider oral narrowing of the central canal. Again, this is a stable finding. Osteopenia persists. Anterolisthesis C4 upon C5 is stable. Reversal of cervical lordosis is also stable. There is no obvious soft tissue injury. No obvious acute fracture line is present. IMPRESSION: There is a small amount of fluid in the right mastoid air cells but otherwise no evidence of acute  intracranial pathology. This is a stable finding. Chronic odontoid neck fracture is stable. No acute fracture in the cervical spine. Electronically Signed   By: Marybelle Killings M.D.   On: 10/25/2015 11:51   Ct Head Wo Contrast  10/10/2015  CLINICAL DATA:  pt was found in the hallway after falling. Amount of time patient was on the ground is unknown; neck pain; hx cervical fx EXAM: CT HEAD WITHOUT CONTRAST CT CERVICAL SPINE WITHOUT CONTRAST TECHNIQUE: Multidetector CT imaging of the head and cervical spine was performed following the standard protocol without intravenous contrast. Multiplanar CT image reconstructions of the cervical spine were also generated. COMPARISON:  08/13/2015 FINDINGS: CT HEAD FINDINGS The ventricles are normal configuration. There is ventricular and sulcal enlargement reflecting moderate atrophy, stable from the prior study. There are no parenchymal masses or mass effect. There is no evidence of a cortical infarct. Patchy white matter hypoattenuation is noted bilaterally consistent with moderate chronic microvascular ischemic change, also stable There are no extra-axial masses or abnormal fluid collections. There is no intracranial hemorrhage. No skull fracture.  Sinuses and mastoid air cells are clear. CT CERVICAL SPINE FINDINGS Chronic CT fracture crossing the base of the odontoid, unchanged from prior study. No new fractures. Moderate to marked loss of disc height throughout the cervical spine. Milder facet degenerative changes. Grade 1 anterolisthesis of C4 on C5. These findings are stable. No soft tissue masses or adenopathy. There are carotid vascular calcifications. Lung apices show mild interstitial thickening and scarring, chronic. IMPRESSION: HEAD CT: No acute intracranial abnormalities. No skull fracture. Moderate atrophy and chronic microvascular ischemic change stable from the prior study. CERVICAL CT: No acute fracture. Chronic C2 fracture and chronic degenerative changes.  Electronically Signed   By: Lajean Manes M.D.   On: 10/10/2015 20:29   Ct Cervical Spine Wo Contrast  10/25/2015  CLINICAL DATA:  Fall EXAM: CT HEAD WITHOUT CONTRAST CT CERVICAL SPINE WITHOUT CONTRAST TECHNIQUE: Multidetector CT imaging of the head and cervical spine was performed following the standard protocol without intravenous contrast. Multiplanar CT image reconstructions of the cervical spine were also generated. COMPARISON:  10/10/2015 FINDINGS: CT HEAD FINDINGS Global atrophy. Chronic ischemic changes in the periventricular white matter. No mass effect, midline shift, or acute intracranial hemorrhage. There is trace fluid in the right mastoid air cells. Left mastoidectomy has been performed. No evidence of skull base or cranial fracture. CT CERVICAL SPINE FINDINGS The chronic fracture through the neck of the odontoid is stable. There is persistent and stable retrolisthesis of the odontoid with respect to the C2 body. The posterior arch of C1 protrudes through the foramen magnum and this results then consider oral narrowing of the central canal. Again, this is a stable finding. Osteopenia persists. Anterolisthesis C4 upon C5 is stable. Reversal of cervical lordosis is also stable. There is no obvious soft tissue injury. No obvious acute fracture line is present. IMPRESSION:  There is a small amount of fluid in the right mastoid air cells but otherwise no evidence of acute intracranial pathology. This is a stable finding. Chronic odontoid neck fracture is stable. No acute fracture in the cervical spine. Electronically Signed   By: Marybelle Killings M.D.   On: 10/25/2015 11:51   Ct Cervical Spine Wo Contrast  10/10/2015  CLINICAL DATA:  pt was found in the hallway after falling. Amount of time patient was on the ground is unknown; neck pain; hx cervical fx EXAM: CT HEAD WITHOUT CONTRAST CT CERVICAL SPINE WITHOUT CONTRAST TECHNIQUE: Multidetector CT imaging of the head and cervical spine was performed following  the standard protocol without intravenous contrast. Multiplanar CT image reconstructions of the cervical spine were also generated. COMPARISON:  08/13/2015 FINDINGS: CT HEAD FINDINGS The ventricles are normal configuration. There is ventricular and sulcal enlargement reflecting moderate atrophy, stable from the prior study. There are no parenchymal masses or mass effect. There is no evidence of a cortical infarct. Patchy white matter hypoattenuation is noted bilaterally consistent with moderate chronic microvascular ischemic change, also stable There are no extra-axial masses or abnormal fluid collections. There is no intracranial hemorrhage. No skull fracture.  Sinuses and mastoid air cells are clear. CT CERVICAL SPINE FINDINGS Chronic CT fracture crossing the base of the odontoid, unchanged from prior study. No new fractures. Moderate to marked loss of disc height throughout the cervical spine. Milder facet degenerative changes. Grade 1 anterolisthesis of C4 on C5. These findings are stable. No soft tissue masses or adenopathy. There are carotid vascular calcifications. Lung apices show mild interstitial thickening and scarring, chronic. IMPRESSION: HEAD CT: No acute intracranial abnormalities. No skull fracture. Moderate atrophy and chronic microvascular ischemic change stable from the prior study. CERVICAL CT: No acute fracture. Chronic C2 fracture and chronic degenerative changes. Electronically Signed   By: Lajean Manes M.D.   On: 10/10/2015 20:29   Pelvis Portable  10/28/2015  CLINICAL DATA:  Postop IM nail placement EXAM: PORTABLE PELVIS 1-2 VIEWS COMPARISON:  Portable exam 1202 hours compared to intraoperative images of 10/28/2015 FINDINGS: Distal extent of nail not imaged. IM nail with compression screw at proximal femur across the intertrochanteric fracture. No femoral head dislocation. Hip joint spaces symmetric and preserved. Bones demineralized. Extensive atherosclerotic calcifications. IMPRESSION:  IM nail with compression screw at proximal RIGHT femur post ORIF. Osseous demineralization without additional fracture dislocation. Electronically Signed   By: Lavonia Dana M.D.   On: 10/28/2015 12:32   Dg Chest Port 1 View  10/27/2015  CLINICAL DATA:  Aspiration pneumonia. EXAM: PORTABLE CHEST 1 VIEW COMPARISON:  Chest x-ray dated 10/25/2015 and chest x-ray dated 08/14/2015. FINDINGS: New dense opacity at the right lung base consistent with the given history of aspiration and/or pneumonia. Left lung remains clear. Cardiomediastinal silhouette appears stable in size and configuration. Extensive atherosclerotic calcifications again noted along the course of the thoracic aorta. Degenerative changes are seen throughout the scoliotic thoracolumbar spine. No acute- appearing osseous abnormality. IMPRESSION: New dense opacity at the right lung base consistent with the given history of aspiration and/or pneumonia. Electronically Signed   By: Franki Cabot M.D.   On: 10/27/2015 20:31   Dg Knee Complete 4 Views Left  10/10/2015  CLINICAL DATA:  82 year old female with fall and left knee pain. EXAM: LEFT KNEE - COMPLETE 4+ VIEW COMPARISON:  None. FINDINGS: There is no acute fracture or dislocation. Osteopenia. There is narrowing of the knee compartment compatible with chronic and osteoarthritic changes. No joint  effusion. The soft tissues appear unremarkable. IMPRESSION: No acute/ traumatic osseous pathology. Electronically Signed   By: Anner Crete M.D.   On: 10/10/2015 20:29   Dg C-arm 61-120 Min  10/28/2015  CLINICAL DATA:  Right femur IM nail EXAM: DG C-ARM 61-120 MIN; RIGHT FEMUR 2 VIEWS COMPARISON:  10/25/2015 FINDINGS: Four images from C-arm radiography shows a IM nail reducing the proximal right femur fracture. The hardware components and fracture fragments are in anatomic alignment. No complications. IMPRESSION: 1. Status post ORIF of proximal femur fracture. Electronically Signed   By: Kerby Moors M.D.    On: 10/28/2015 10:47   Dg Hip Unilat With Pelvis 2-3 Views Right  10/25/2015  CLINICAL DATA:  Pain following fall earlier today EXAM: DG HIP (WITH OR WITHOUT PELVIS) 2-3V RIGHT COMPARISON:  June 10, 2013 FINDINGS: Frontal pelvis as well as frontal and lateral right hip images were obtained. There is a comminuted intertrochanteric femur fracture on the right with marked varus angulation at the fracture site. No other acute fractures are evident. There are old fractures of each superior pubic ramus and ischium with remodeling. No dislocation. There is mild narrowing of both hip joints. Bones are diffusely osteoporotic. There is extensive arterial vascular calcification at multiple sites. IMPRESSION: Comminuted intertrochanteric femur fracture on the right with marked varus angulation at the fracture site. Old fractures of the superior and inferior pubic rami with remodeling. No dislocation. Diffuse osteoporosis. Extensive arterial vascular calcification present. Electronically Signed   By: Lowella Grip III M.D.   On: 10/25/2015 11:41   Dg Femur, Min 2 Views Right  10/28/2015  CLINICAL DATA:  Right femur IM nail EXAM: DG C-ARM 61-120 MIN; RIGHT FEMUR 2 VIEWS COMPARISON:  10/25/2015 FINDINGS: Four images from C-arm radiography shows a IM nail reducing the proximal right femur fracture. The hardware components and fracture fragments are in anatomic alignment. No complications. IMPRESSION: 1. Status post ORIF of proximal femur fracture. Electronically Signed   By: Kerby Moors M.D.   On: 10/28/2015 10:47   Dg Femur, Min 2 Views Right  10/25/2015  CLINICAL DATA:  False morning with right hip pain with known intratrochanteric femoral fracture, initial encounter EXAM: RIGHT FEMUR 2 VIEWS COMPARISON:  None. FINDINGS: Distal femur and knee joint appear within normal limits. The examination is somewhat limited secondary to the patient's inability to position herself. IMPRESSION: No acute abnormality is noted in  the distal femur. Electronically Signed   By: Inez Catalina M.D.   On: 10/25/2015 15:12   Dg Femur Port, Min 2 Views Right  10/28/2015  CLINICAL DATA:  Postop femur fixation. EXAM: RIGHT FEMUR PORTABLE 1 VIEW COMPARISON:  Earlier today at 959 hours. FINDINGS: Vascular calcifications. Intra medullary rod with proximal and distal fixation devices across the femur. No acute hardware complication. Intertrochanteric femur fracture again identified, status post reduction. IMPRESSION: Interval fixation of intertrochanteric right femur fracture. Electronically Signed   By: Abigail Miyamoto M.D.   On: 10/28/2015 13:09    Microbiology: Recent Results (from the past 240 hour(s))  Urine culture     Status: None   Collection Time: 10/25/15 11:50 AM  Result Value Ref Range Status   Specimen Description URINE, CATHETERIZED  Final   Special Requests NONE  Final   Culture   Final    NO GROWTH 1 DAY Performed at [facility]    Report Status 10/26/2015 FINAL  Final  Surgical pcr screen     Status: Abnormal   Collection Time: 10/26/15  11:28 AM  Result Value Ref Range Status   MRSA, PCR NEGATIVE NEGATIVE Final   Staphylococcus aureus POSITIVE (A) NEGATIVE Final    Comment:        The Xpert SA Assay (FDA approved for NASAL specimens in patients over 5 years of age), is one component of a comprehensive surveillance program.  Test performance has been validated by [facility] for patients greater than or equal to 31 year old. It is not intended to diagnose infection nor to guide or monitor treatment.      Labs: Basic Metabolic Panel:  Recent Labs Lab 10/25/15 1155 10/26/15 1132 10/27/15 0418 10/27/15 1854 10/28/15 0816 10/29/15 0634 10/30/15 0531  NA 134* 136 140  --  142 145 149*  K 4.5 4.3 3.7  --  3.9 3.2* 3.3*  CL 95* 99* 104  --   --  112* 114*  CO2 30 23 25   --   --  25 24  GLUCOSE 175* 165* 118*  --  102* 105* 105*  BUN 17 31* 35*  --   --  16 23*  CREATININE 0.74 1.64*  1.22*  --   --  0.71 0.77  CALCIUM 8.7* 8.5* 8.5*  --   --  8.1* 8.4*  MG  --   --   --  2.4  --   --   --    Liver Function Tests:  Recent Labs Lab 10/25/15 1155  AST 30  ALT 15  ALKPHOS 90  BILITOT 0.7  PROT 6.5  ALBUMIN 3.3*   No results for input(s): LIPASE, AMYLASE in the last 168 hours. No results for input(s): AMMONIA in the last 168 hours. CBC:  Recent Labs Lab 10/25/15 1155 10/26/15 1132 10/27/15 0418 10/28/15 0816 10/28/15 1513 10/29/15 0634 10/30/15 0531  WBC 17.3* 13.9* 13.0*  --  12.3* 13.7* 13.4*  NEUTROABS 15.6*  --   --   --   --   --   --   HGB 11.2* 9.4* 8.3* 7.8* 7.6* 8.1* 7.9*  HCT 33.0* 26.9* 25.1* 23.0* 22.8* 24.4* 24.1*  MCV 98.8 96.4 97.3  --  95.4 95.3 95.3  PLT 272 198 182  --  81* 80* 94*   Cardiac Enzymes:  Recent Labs Lab 10/27/15 1854 10/28/15 0054  TROPONINI 0.07* 0.08*   BNP: BNP (last 3 results) No results for input(s): BNP in the last 8760 hours.  ProBNP (last 3 results) No results for input(s): PROBNP in the last 8760 hours.  CBG:  Recent Labs Lab 10/26/15 1156 10/29/15 0118 10/29/15 0511 10/29/15 1216 10/30/15 0654  GLUCAP 145* 96 95 104* 89    Signed:  CHIU, STEPHEN K  [facility] 10/30/2015, 1:52 PM

## 2015-10-30 NOTE — Progress Notes (Signed)
Pt ready for d/c to SNF per MD. Report called to Romie Minus at [facility], all questions answered. Pt's peripheral IV removed, and d/c'ed from tele. Belongings gathered and will be sent with pts daughter. Waiting for transportation via [facility]; will continue to monitor until that time.   [place], Jerry Caras

## 2015-10-30 NOTE — Clinical Social Work Note (Cosign Needed)
BSW intern to arrange transport via [facility]. Patient's son, [name] has been made aware of patient's discharge plan.   BSW intern is signing off. If any further Social Work needs arises, please re-consult.  [place] intern [phone]

## 2015-10-30 NOTE — Progress Notes (Signed)
Pt still waiting for transportation to [facility] by Corey Harold. [facility] was called approximately 5:30 and they stated that pt was next on the list for [facility].    [place], Jerry Caras

## 2015-10-30 NOTE — Progress Notes (Signed)
Patient ID: Barbara Snyder, female   DOB: 04/06/1920, 82 y.o.   MRN: [id number] Patient ID: Barbara Snyder, female   DOB: Mar 19, 1920, 82 y.o.   MRN: [id number]    Subjective:  Denies SSCP, palpitations or Dyspnea   Objective:  Filed Vitals:   10/29/15 1406 10/29/15 1428 10/29/15 2035 10/30/15 0700  BP: 116/61 115/71 91/46 130/71  Pulse: 92 120 107 68  Temp:  98.4 F (36.9 C) 98.8 F (37.1 C) 98.4 F (36.9 C)  TempSrc:  Oral Oral   Resp:  18 17 16   Weight:      SpO2:  93% 91% 93%    Intake/Output from previous day: No intake or output data in the 24 hours ending 10/30/15 0807  Physical Exam: Advanced dementia  Frail chronically ill white female  HEENT: normal Neck supple with no adenopathy JVP normal no bruits no thyromegaly Lungs clear with no wheezing and good diaphragmatic motion Heart:  S1/S2 no murmur, no rub, gallop or click PMI normal Abdomen: benighn, BS positve, no tenderness, no AAA no bruit.  No HSM or HJR Distal pulses intact with no bruits No edema Neuro non-focal Skin breakdown buttocks Post right hip fracture    Lab Results: Basic Metabolic Panel:  Recent Labs  10/27/15 1854  10/29/15 0634 10/30/15 0531  NA  --   < > 145 149*  K  --   < > 3.2* 3.3*  CL  --   --  112* 114*  CO2  --   --  25 24  GLUCOSE  --   < > 105* 105*  BUN  --   --  16 23*  CREATININE  --   --  0.71 0.77  CALCIUM  --   --  8.1* 8.4*  MG 2.4  --   --   --   < > = values in this interval not displayed. CBC:  Recent Labs  10/29/15 0634 10/30/15 0531  WBC 13.7* 13.4*  HGB 8.1* 7.9*  HCT 24.4* 24.1*  MCV 95.3 95.3  PLT 80* 94*   Cardiac Enzymes:  Recent Labs  10/27/15 1854 10/28/15 0054  TROPONINI 0.07* 0.08*    Imaging: Pelvis Portable  10/28/2015  CLINICAL DATA:  Postop IM nail placement EXAM: PORTABLE PELVIS 1-2 VIEWS COMPARISON:  Portable exam 1202 hours compared to intraoperative images of 10/28/2015 FINDINGS: Distal extent of nail not imaged.  IM nail with compression screw at proximal femur across the intertrochanteric fracture. No femoral head dislocation. Hip joint spaces symmetric and preserved. Bones demineralized. Extensive atherosclerotic calcifications. IMPRESSION: IM nail with compression screw at proximal RIGHT femur post ORIF. Osseous demineralization without additional fracture dislocation. Electronically Signed   By: Lavonia Dana M.D.   On: 10/28/2015 12:32   Dg C-arm 61-120 Min  10/28/2015  CLINICAL DATA:  Right femur IM nail EXAM: DG C-ARM 61-120 MIN; RIGHT FEMUR 2 VIEWS COMPARISON:  10/25/2015 FINDINGS: Four images from C-arm radiography shows a IM nail reducing the proximal right femur fracture. The hardware components and fracture fragments are in anatomic alignment. No complications. IMPRESSION: 1. Status post ORIF of proximal femur fracture. Electronically Signed   By: Kerby Moors M.D.   On: 10/28/2015 10:47   Dg Femur, Min 2 Views Right  10/28/2015  CLINICAL DATA:  Right femur IM nail EXAM: DG C-ARM 61-120 MIN; RIGHT FEMUR 2 VIEWS COMPARISON:  10/25/2015 FINDINGS: Four images from C-arm radiography shows a IM nail reducing the proximal right femur fracture. The hardware components  and fracture fragments are in anatomic alignment. No complications. IMPRESSION: 1. Status post ORIF of proximal femur fracture. Electronically Signed   By: Kerby Moors M.D.   On: 10/28/2015 10:47   Dg Femur Port, Min 2 Views Right  10/28/2015  CLINICAL DATA:  Postop femur fixation. EXAM: RIGHT FEMUR PORTABLE 1 VIEW COMPARISON:  Earlier today at 959 hours. FINDINGS: Vascular calcifications. Intra medullary rod with proximal and distal fixation devices across the femur. No acute hardware complication. Intertrochanteric femur fracture again identified, status post reduction. IMPRESSION: Interval fixation of intertrochanteric right femur fracture. Electronically Signed   By: Abigail Miyamoto M.D.   On: 10/28/2015 13:09    Cardiac Studies:  ECG:   PAF  rate 137   Telemetry:  ST  PAC;s  10/30/2015   Echo:   Medications:   . antiseptic oral rinse  7 mL Mouth Rinse q12n4p  . chlorhexidine  15 mL Mouth Rinse BID  . metoprolol tartrate  25 mg Oral 3 times per day  . potassium chloride SA  40 mEq Oral Once     . dextrose 5 % and 0.9% NaCl 70 mL/hr at 10/28/15 1100    Assessment/Plan:   ST:  Check ECG does not appear to be atrial tachycardia Ok to do PT/OT  Pain control  Increase beta blocker  Hip:  Routine post op care Ok to go to SNF   No anticoagulation for PAF given age and advanced dementia    Jenkins Rouge 10/30/2015, 8:07 AM

## 2015-10-30 NOTE — Clinical Social Work Placement (Cosign Needed)
   CLINICAL SOCIAL WORK PLACEMENT  NOTE  Date:  10/30/2015  Patient Details  Name: Barbara Snyder MRN: [id number] Date of Birth: October 26, 1919  Clinical Social Work is seeking post-discharge placement for this patient at the [place] level of care (*CSW will initial, date and re-position this form in  chart as items are completed):  Yes   Patient/family provided with [facility] Work Department's list of facilities offering this level of care within the geographic area requested by the patient (or if unable, by the patient's family).  Yes   Patient/family informed of their freedom to choose among providers that offer the needed level of care, that participate in Medicare, Medicaid or managed care program needed by the patient, have an available bed and are willing to accept the patient.  Yes   Patient/family informed of [facility]'s ownership interest in [facility] and [facility], as well as of the fact that they are under no obligation to receive care at these facilities.  PASRR submitted to EDS on 10/29/15     PASRR number received on 10/29/15     Existing PASRR number confirmed on       FL2 transmitted to all facilities in geographic area requested by pt/family on 10/29/15     FL2 transmitted to all facilities within larger geographic area on       Patient informed that his/her managed care company has contracts with or will negotiate with certain facilities, including the following:        Yes   Patient/family informed of bed offers received.  Patient chooses bed at  [facility])     Physician recommends and patient chooses bed at      Patient to be transferred to  [facility]) on 10/30/15.  Patient to be transferred to facility by  [facility])     Patient family notified on 10/30/15 of transfer.  Name of family member notified:   (son- Tonjua Abrego)     PHYSICIAN       Additional Comment:     _______________________________________________ Leane Call, Student-SW 10/30/2015, 2:08 PM

## 2015-10-31 LAB — TYPE AND SCREEN
ABO/RH(D): A POS
Antibody Screen: NEGATIVE
UNIT DIVISION: 0
UNIT DIVISION: 0
UNIT DIVISION: 0
UNIT DIVISION: 0
UNIT DIVISION: 0
Unit division: 0

## 2015-11-01 ENCOUNTER — Encounter (HOSPITAL_COMMUNITY): Payer: Self-pay | Admitting: Orthopedic Surgery

## 2015-11-01 NOTE — OR Nursing (Signed)
Late entry for correction of mistyped time in the room.

## 2015-11-02 ENCOUNTER — Encounter (HOSPITAL_COMMUNITY): Payer: Self-pay | Admitting: Orthopedic Surgery

## 2015-11-24 DEATH — deceased

## 2016-02-07 ENCOUNTER — Ambulatory Visit (INDEPENDENT_AMBULATORY_CARE_PROVIDER_SITE_OTHER): Payer: Medicare Other | Admitting: Otolaryngology

## 2017-04-12 IMAGING — CT CT CERVICAL SPINE W/O CM
4 of 5 series · 13 of 33 positions shown, 15 images · non-contrast
Comparison: 10/10/2015

CLINICAL DATA: Fall

EXAM:
CT HEAD WITHOUT CONTRAST
CT CERVICAL SPINE WITHOUT CONTRAST
TECHNIQUE: Multidetector CT imaging of the head and cervical spine was
performed following the standard protocol without intravenous
contrast. Multiplanar CT image reconstructions of the cervical spine
were also generated.

[Series 2: headseq 4.8 h37s · axial · 0.47mm/px · z∈[+172,+312]mm · 3 of 30 slices shown, 4 images]
[im 1/30  soft-tissue]
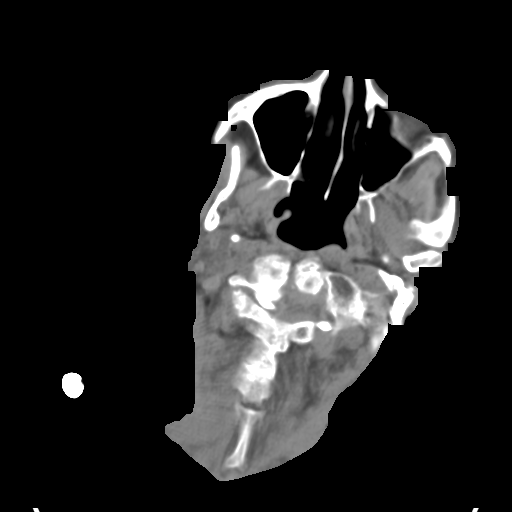
[im 1/30  bone]
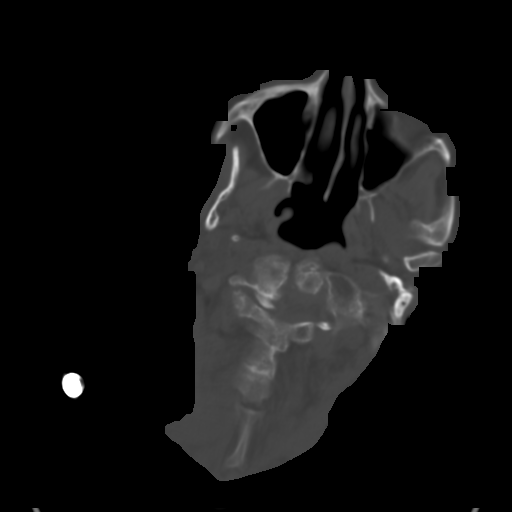
[im 15/30  bone]
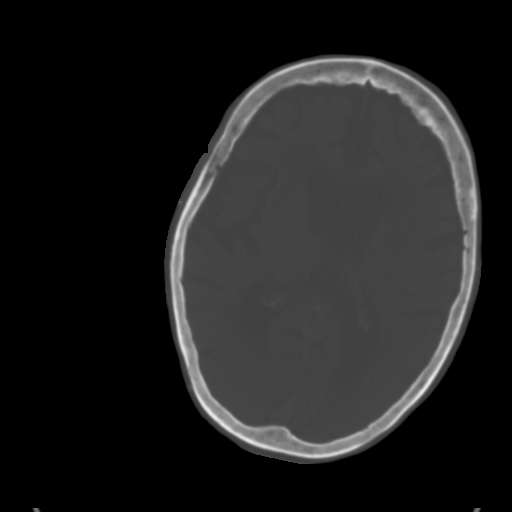
[im 30/30  bone]
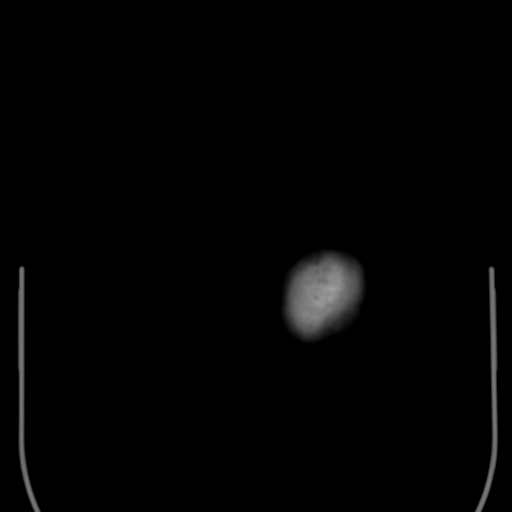

[Series 5: cervical st 2.0 b31s · axial · 0.27mm/px · z∈[+100,+122]mm · 2 of 56 slices shown]
[im 12/56  bone]
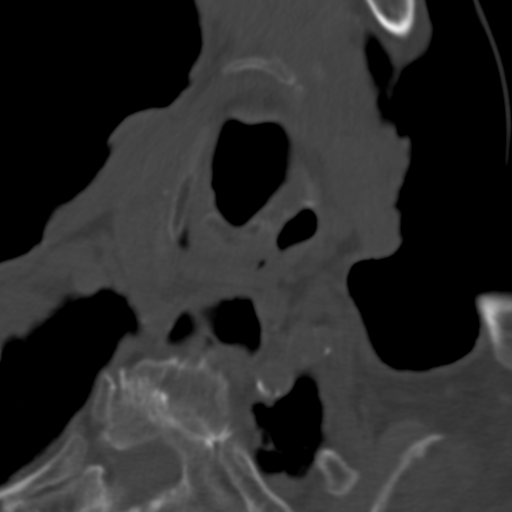
[im 23/56  bone]
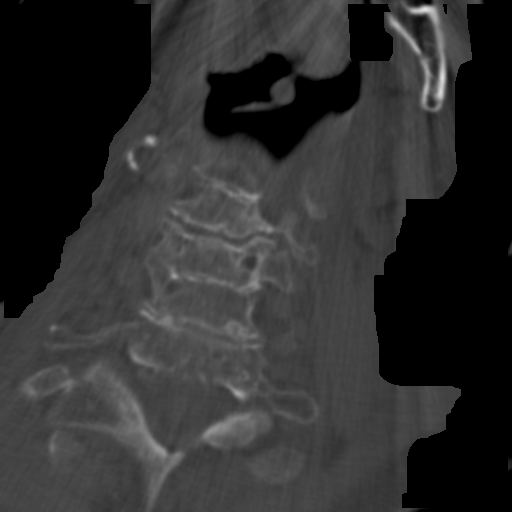

[Series 7: sagittal bone 2.0 · sagittal · 0.19mm/px · 5 of 41 slices shown, 6 images]
[im 14/41  bone]
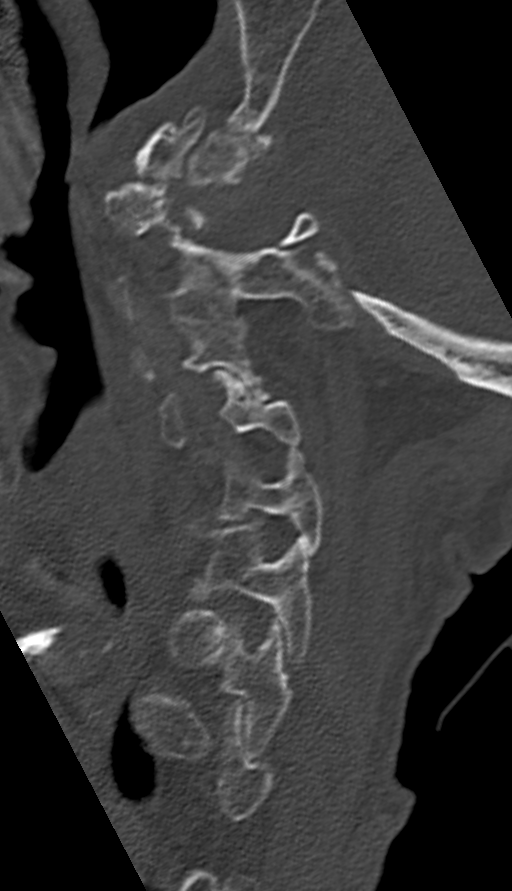
[im 17/41  bone]
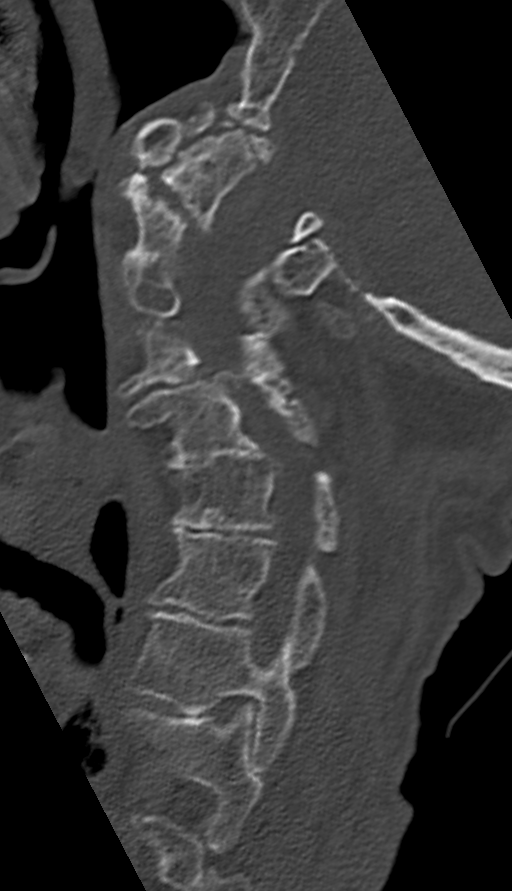
[im 21/41  soft-tissue]
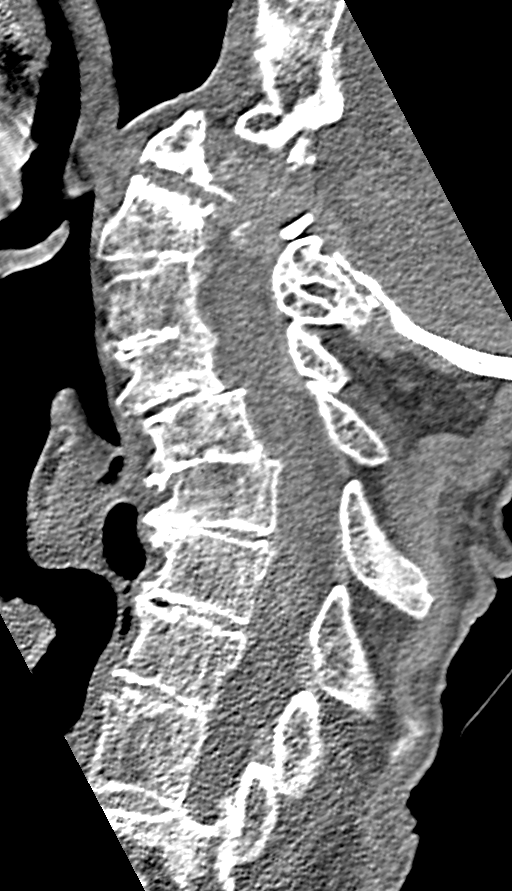
[im 21/41  bone]
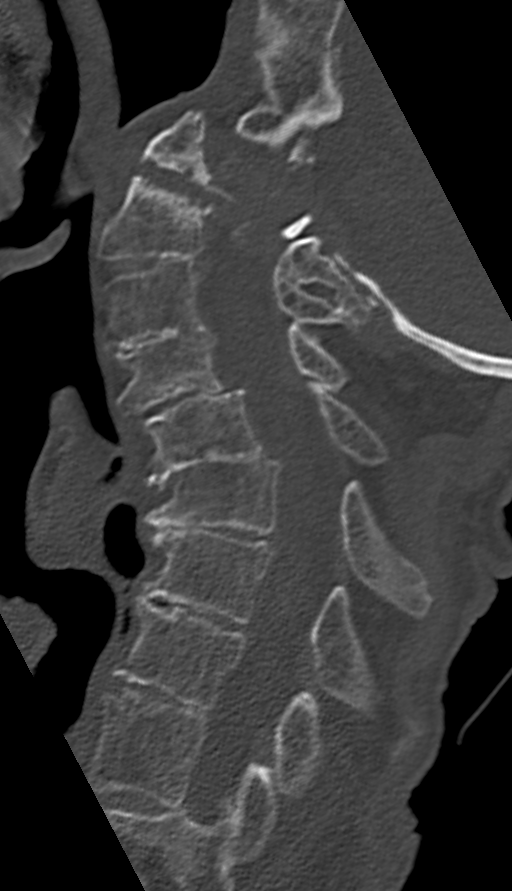
[im 24/41  bone]
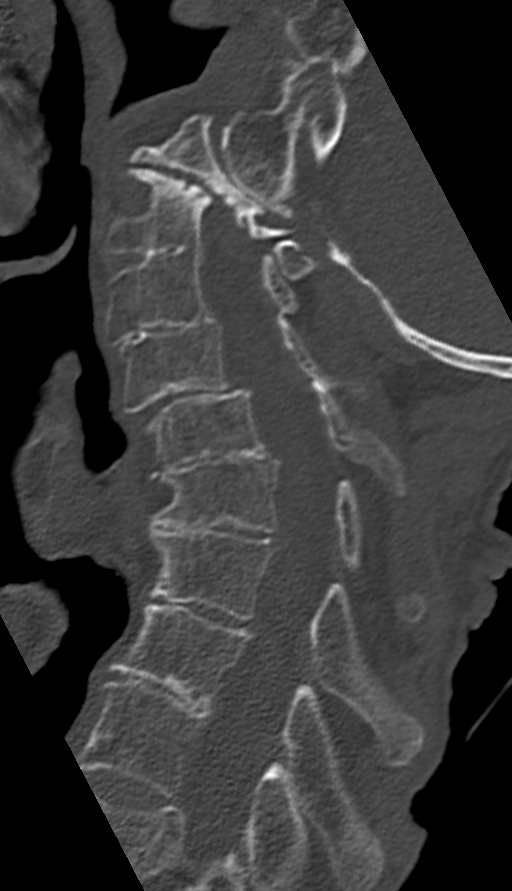
[im 27/41  bone]
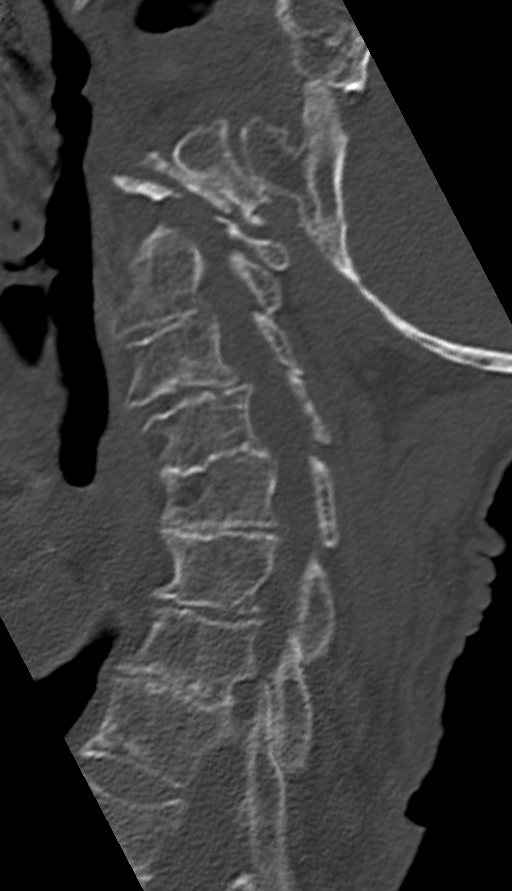

[Series 9: axial bone 2.0 · coronal · 0.16mm/px · 3 of 68 slices shown]
[im 19/68  bone]
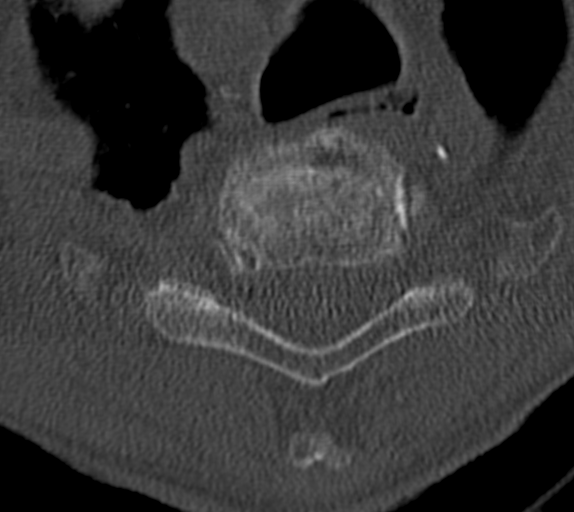
[im 29/68  bone]
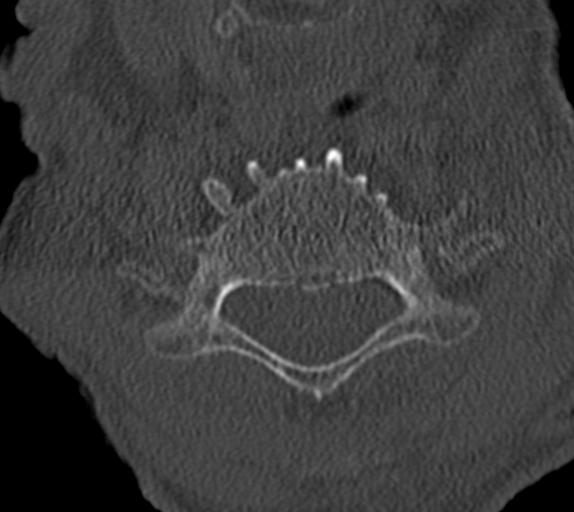
[im 39/68  bone]
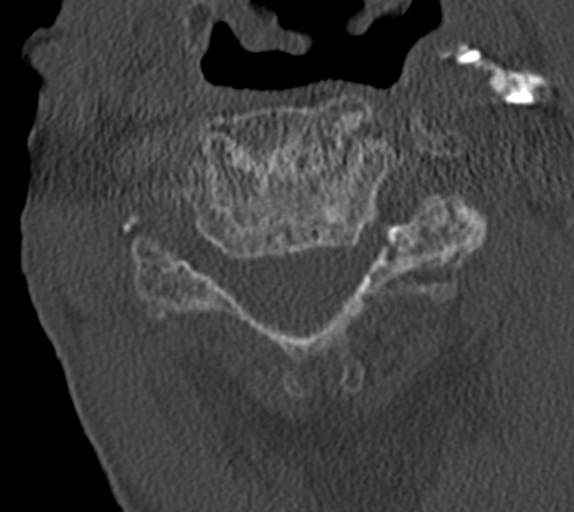

[13 of 33 positions shown; findings below may reference images not displayed]

FINDINGS: CT HEAD FINDINGS

Global atrophy. Chronic ischemic changes in the periventricular
white matter. No mass effect, midline shift, or acute intracranial
hemorrhage. There is trace fluid in the right mastoid air cells.
Left mastoidectomy has been performed. No evidence of skull base or
cranial fracture.

CT CERVICAL SPINE FINDINGS

The chronic fracture through the neck of the odontoid is stable.
There is persistent and stable retrolisthesis of the odontoid with
respect to the C2 body. The posterior arch of C1 protrudes through
the foramen magnum and this results then consider oral narrowing of
the central canal. Again, this is a stable finding. Osteopenia
persists. Anterolisthesis C4 upon C5 is stable. Reversal of cervical
lordosis is also stable. There is no obvious soft tissue injury. No
obvious acute fracture line is present.
IMPRESSION: There is a small amount of fluid in the right mastoid air cells but
otherwise no evidence of acute intracranial pathology. This is a
stable finding.

Chronic odontoid neck fracture is stable. No acute fracture in the
cervical spine.

## 2017-04-12 IMAGING — DX DG FEMUR 2+V*R*
2 series · 2 of 2 positions shown · non-contrast
Comparison: None.

CLINICAL DATA: False morning with right hip pain with known
intratrochanteric femoral fracture, initial encounter

EXAM:
RIGHT FEMUR 2 VIEWS

[femur ap (1 of 2)]
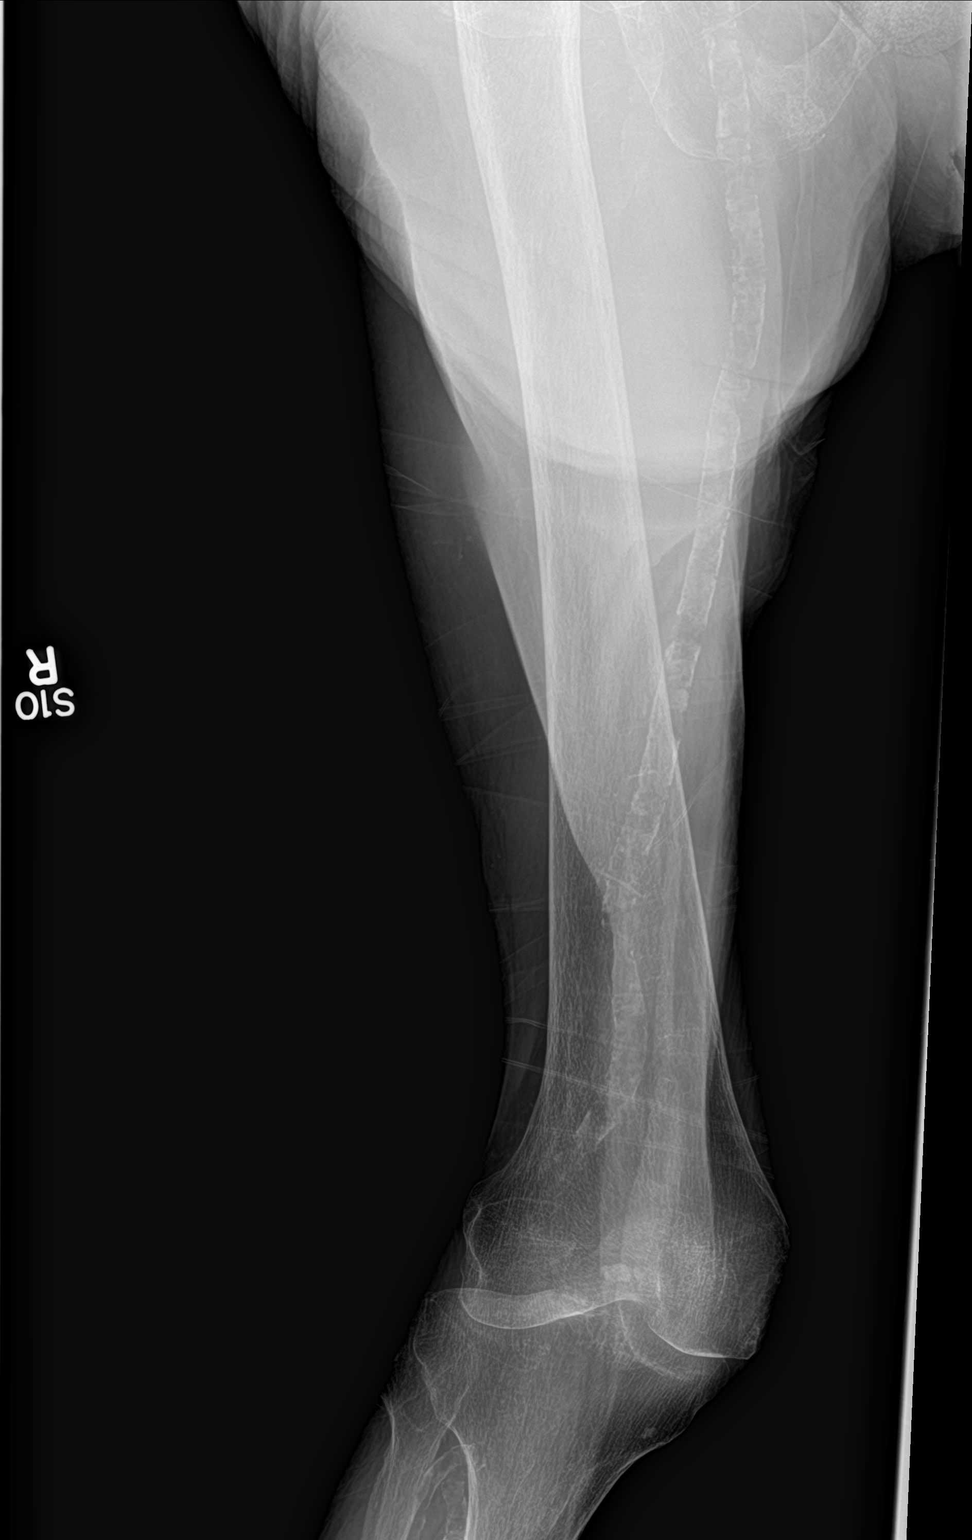

[femur ap (2 of 2)]
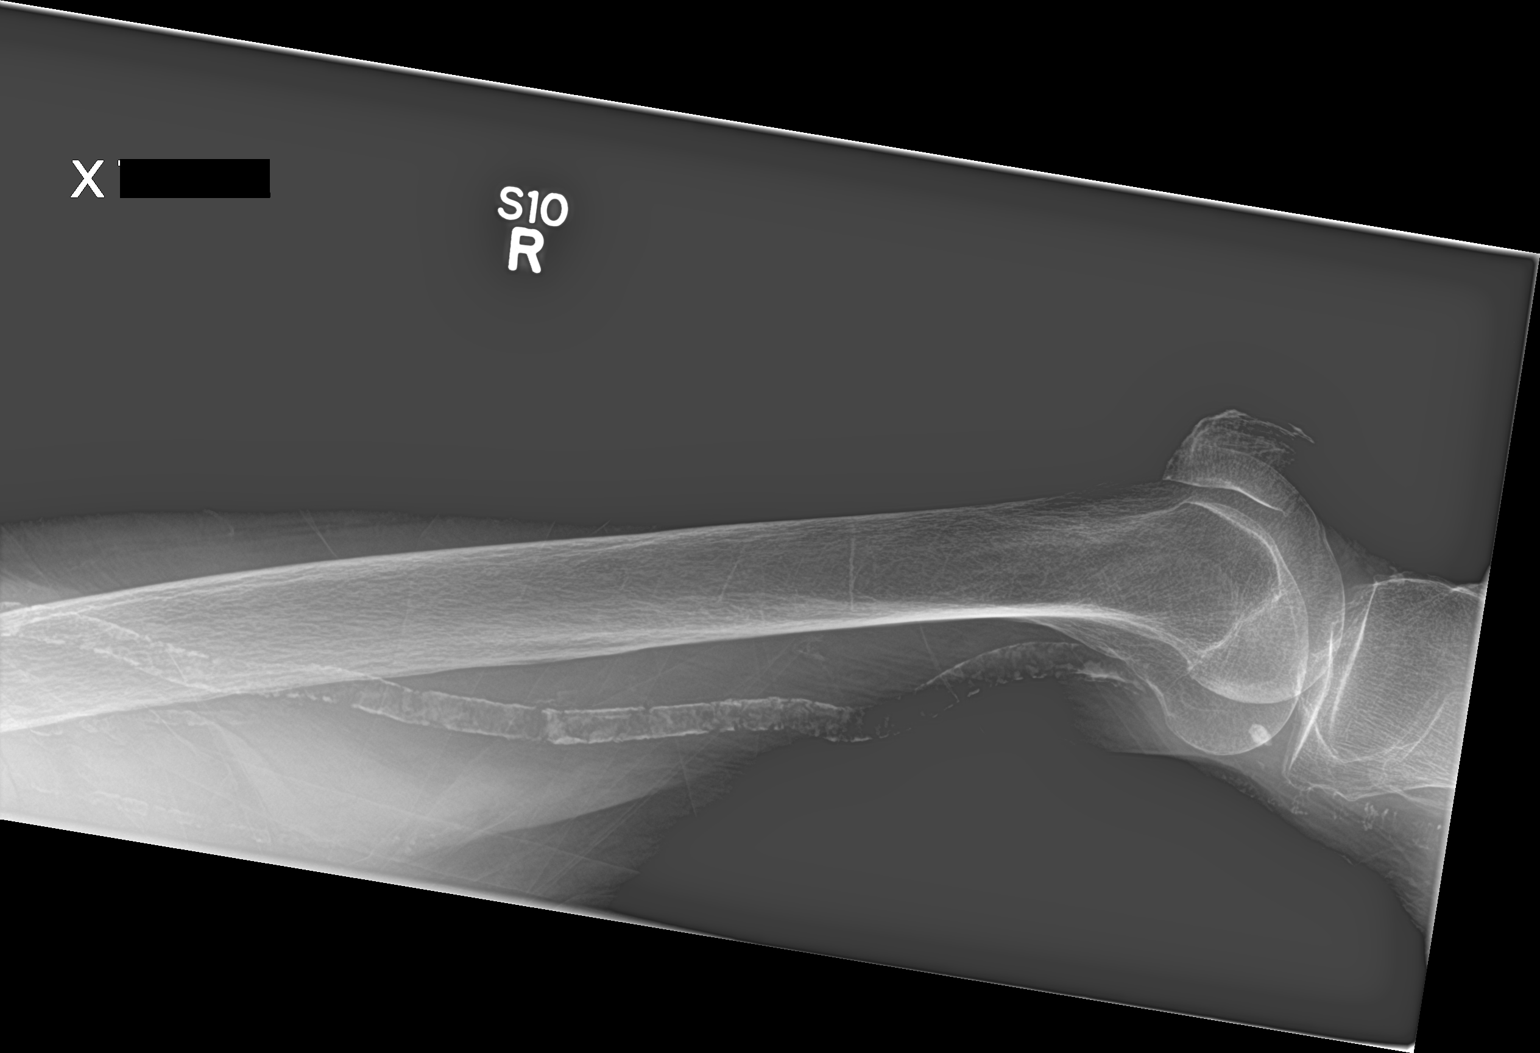

[2 of 2 positions shown; findings below may reference images not displayed]

FINDINGS: Distal femur and knee joint appear within normal limits. The
examination is somewhat limited secondary to the patient's inability
to position herself.
IMPRESSION: No acute abnormality is noted in the distal femur.
